# Patient Record
Sex: Male | Born: 1950 | Race: White | Hispanic: No | Marital: Married | Smoking: Never smoker
Health system: Southern US, Community
[De-identification: ages and names within clinical notes are randomized; demographics above are authoritative.]

## PROBLEM LIST (undated history)

## (undated) DIAGNOSIS — M545 Low back pain, unspecified: Secondary | ICD-10-CM

## (undated) DIAGNOSIS — Z8719 Personal history of other diseases of the digestive system: Secondary | ICD-10-CM

## (undated) DIAGNOSIS — IMO0001 Reserved for inherently not codable concepts without codable children: Secondary | ICD-10-CM

## (undated) DIAGNOSIS — G629 Polyneuropathy, unspecified: Secondary | ICD-10-CM

## (undated) DIAGNOSIS — C430 Malignant melanoma of lip: Secondary | ICD-10-CM

## (undated) DIAGNOSIS — K219 Gastro-esophageal reflux disease without esophagitis: Secondary | ICD-10-CM

## (undated) DIAGNOSIS — Z531 Procedure and treatment not carried out because of patient's decision for reasons of belief and group pressure: Secondary | ICD-10-CM

## (undated) DIAGNOSIS — Z201 Contact with and (suspected) exposure to tuberculosis: Secondary | ICD-10-CM

## (undated) DIAGNOSIS — E785 Hyperlipidemia, unspecified: Secondary | ICD-10-CM

## (undated) DIAGNOSIS — G8929 Other chronic pain: Secondary | ICD-10-CM

## (undated) DIAGNOSIS — I2510 Atherosclerotic heart disease of native coronary artery without angina pectoris: Secondary | ICD-10-CM

## (undated) DIAGNOSIS — C44310 Basal cell carcinoma of skin of unspecified parts of face: Secondary | ICD-10-CM

## (undated) HISTORY — PX: CORONARY ANGIOPLASTY WITH STENT PLACEMENT: SHX49

## (undated) HISTORY — DX: Polyneuropathy, unspecified: G62.9

## (undated) HISTORY — PX: TONSILLECTOMY: SUR1361

## (undated) HISTORY — DX: Hyperlipidemia, unspecified: E78.5

## (undated) HISTORY — DX: Atherosclerotic heart disease of native coronary artery without angina pectoris: I25.10

---

## 2001-05-10 ENCOUNTER — Inpatient Hospital Stay (HOSPITAL_COMMUNITY): Admission: AD | Admit: 2001-05-10 | Discharge: 2001-05-14 | Payer: Self-pay | Admitting: Cardiology

## 2001-05-10 ENCOUNTER — Encounter: Payer: Self-pay | Admitting: Cardiology

## 2001-10-01 HISTORY — PX: POSTERIOR LUMBAR FUSION: SHX6036

## 2001-10-24 ENCOUNTER — Inpatient Hospital Stay (HOSPITAL_COMMUNITY): Admission: EM | Admit: 2001-10-24 | Discharge: 2001-11-04 | Payer: Self-pay | Admitting: Neurosurgery

## 2001-10-25 ENCOUNTER — Encounter: Payer: Self-pay | Admitting: Neurosurgery

## 2001-10-30 ENCOUNTER — Encounter: Payer: Self-pay | Admitting: Neurosurgery

## 2001-11-04 ENCOUNTER — Inpatient Hospital Stay (HOSPITAL_COMMUNITY)
Admission: AD | Admit: 2001-11-04 | Discharge: 2001-11-11 | Payer: Self-pay | Admitting: Physical Medicine & Rehabilitation

## 2001-12-31 ENCOUNTER — Encounter: Payer: Self-pay | Admitting: Neurosurgery

## 2001-12-31 ENCOUNTER — Encounter: Admission: RE | Admit: 2001-12-31 | Discharge: 2001-12-31 | Payer: Self-pay | Admitting: Neurosurgery

## 2002-04-15 ENCOUNTER — Encounter: Admission: RE | Admit: 2002-04-15 | Discharge: 2002-04-15 | Payer: Self-pay | Admitting: Neurosurgery

## 2002-04-15 ENCOUNTER — Encounter: Payer: Self-pay | Admitting: Neurosurgery

## 2010-01-06 ENCOUNTER — Encounter: Payer: Self-pay | Admitting: Cardiology

## 2010-01-17 ENCOUNTER — Encounter: Payer: Self-pay | Admitting: Cardiology

## 2010-01-31 HISTORY — PX: CARDIAC CATHETERIZATION: SHX172

## 2010-02-15 ENCOUNTER — Ambulatory Visit: Payer: Self-pay | Admitting: Cardiology

## 2010-02-15 DIAGNOSIS — E785 Hyperlipidemia, unspecified: Secondary | ICD-10-CM | POA: Insufficient documentation

## 2010-02-15 DIAGNOSIS — I25118 Atherosclerotic heart disease of native coronary artery with other forms of angina pectoris: Secondary | ICD-10-CM | POA: Insufficient documentation

## 2010-02-15 DIAGNOSIS — R079 Chest pain, unspecified: Secondary | ICD-10-CM | POA: Insufficient documentation

## 2010-02-17 ENCOUNTER — Observation Stay (HOSPITAL_COMMUNITY): Admission: RE | Admit: 2010-02-17 | Discharge: 2010-02-17 | Payer: Self-pay | Admitting: Cardiology

## 2010-02-17 ENCOUNTER — Ambulatory Visit: Payer: Self-pay | Admitting: Cardiology

## 2010-03-16 ENCOUNTER — Ambulatory Visit: Payer: Self-pay | Admitting: Cardiology

## 2010-05-04 ENCOUNTER — Encounter: Payer: Self-pay | Admitting: Cardiology

## 2010-05-16 ENCOUNTER — Telehealth: Payer: Self-pay | Admitting: Cardiology

## 2010-06-16 ENCOUNTER — Ambulatory Visit: Payer: Self-pay | Admitting: Cardiology

## 2010-06-23 ENCOUNTER — Encounter: Payer: Self-pay | Admitting: Cardiology

## 2010-06-30 ENCOUNTER — Encounter: Payer: Self-pay | Admitting: Cardiology

## 2010-06-30 ENCOUNTER — Ambulatory Visit: Admit: 2010-06-30 | Payer: Self-pay | Admitting: Cardiology

## 2010-06-30 ENCOUNTER — Ambulatory Visit (HOSPITAL_COMMUNITY)
Admission: RE | Admit: 2010-06-30 | Discharge: 2010-06-30 | Payer: Self-pay | Source: Home / Self Care | Attending: Cardiology | Admitting: Cardiology

## 2010-06-30 DIAGNOSIS — R5381 Other malaise: Secondary | ICD-10-CM | POA: Insufficient documentation

## 2010-06-30 DIAGNOSIS — R05 Cough: Secondary | ICD-10-CM

## 2010-06-30 DIAGNOSIS — R059 Cough, unspecified: Secondary | ICD-10-CM | POA: Insufficient documentation

## 2010-06-30 DIAGNOSIS — R5383 Other fatigue: Secondary | ICD-10-CM

## 2010-07-01 ENCOUNTER — Telehealth: Payer: Self-pay | Admitting: Cardiology

## 2010-07-18 ENCOUNTER — Telehealth: Payer: Self-pay | Admitting: Cardiology

## 2010-07-31 LAB — CONVERTED CEMR LAB
BUN: 9 mg/dL (ref 6–23)
Calcium: 9 mg/dL (ref 8.4–10.5)
Creatinine, Ser: 0.9 mg/dL (ref 0.4–1.5)
Eosinophils Relative: 1.4 % (ref 0.0–5.0)
GFR calc non Af Amer: 89.23 mL/min (ref >60)
INR: 1 (ref 0.8–1.0)
Monocytes Relative: 6.2 % (ref 3.0–12.0)
Neutrophils Relative %: 60.7 % (ref 43.0–77.0)
Platelets: 220 10*3/uL (ref 150.0–400.0)
Prothrombin Time: 10.3 s (ref 9.7–11.8)
WBC: 7.1 10*3/uL (ref 4.5–10.5)

## 2010-08-02 NOTE — Progress Notes (Signed)
Summary: lipid results 05/06/10  Phone Note Outgoing Call   Call placed by: Katina Dung, RN, BSN,  May 16, 2010 12:37 PM Call placed to: Patient Summary of Call: cholesterol results 05/06/10 from [facility]  Follow-up for Phone Call        Dr Shirlee Latch reviewed lipid results done 05/06/10 faxed from [facility] -LDL 92 -Dr Alford Highland  recommendation was to stop Pravastatin and start Crestor 10mg  daily, if not Lipitor 20mg  LMTCB Katina Dung, RN, BSN  May 16, 2010 12:42 PM   Additional Follow-up for Phone Call Additional follow up Details #1::        per  deborah calling back  [phone] Marc Montes  May 16, 2010 12:52 PM      Appended Document: lipid results 05/06/10 discussed results with pt and wife--pt will stop Pravastatin and start Crestor 10mg  daily--he will plan to repeat L/L 07/15/10--I will give him an order for L/L when he sees Dr Shirlee Latch  06/16/10 so he can have this done at his local PCP   Clinical Lists Changes  Medications: Changed medication from PRAVACHOL 80 MG TABS (PRAVASTATIN SODIUM) one daily in the evening to CRESTOR 10 MG TABS (ROSUVASTATIN CALCIUM) one daily - Signed Rx of CRESTOR 10 MG TABS (ROSUVASTATIN CALCIUM) one daily;  #30 x 3;  Signed;  Entered by: Katina Dung, RN, BSN;  Authorized by: Marca Ancona, MD;  Method used: Electronically to [facility]*, [address], [place], [place]  [address], Ph: [phone], Fax: [phone] Observations: Added new observation of MEDRECON: current updated (05/19/2010 18:07)    Prescriptions: CRESTOR 10 MG TABS (ROSUVASTATIN CALCIUM) one daily  #30 x 3   Entered by:   Katina Dung, RN, BSN   Authorized by:   Marca Ancona, MD   Signed by:   Katina Dung, RN, BSN on 05/19/2010   Method used:   Electronically to        [facility]* (retail)       [address]       [place], [place]  [address]       Ph: [phone]       Fax: [phone]   RxID:    [id number]     Current Medications (verified): 1)  Robaxin 500 Mg Tabs (Methocarbamol) .Marland Kitchen.. 1 Tab By Mouth Once Daily 2)  Neurontin 600 Mg Tabs (Gabapentin) .Marland Kitchen.. 1 Tab By Mouth Three Times A Day 3)  Crestor 10 Mg Tabs (Rosuvastatin Calcium) .... One Daily 4)  Aspirin 81 Mg Tbec (Aspirin) .... Take One Tablet By Mouth Daily 5)  Calcium .... 2 Tabs By Mouth Once Daily 6)  Glucosamine-Chondroitin   Caps (Glucosamine-Chondroit-Vit C-Mn) .Marland Kitchen.. 1 Tab By Mouth Once Daily 7)  Niacin .Marland Kitchen.. 1 Tab By Mouth Once Daily 8)  Folic Acid .Marland Kitchen.. 1 Tab By Mouth Once Daily 9)  Metoprolol Tartrate 25 Mg Tabs (Metoprolol Tartrate) .... One -Half Tablet Twice A Day 10)  Tylox 5-500 Mg Caps (Oxycodone-Acetaminophen) .... As Needed For Pain 11)  Isosorbide Mononitrate Cr 30 Mg Xr24h-Tab (Isosorbide Mononitrate) .... Take One Tablet Once Daily  Allergies: 1)  ! Zocor

## 2010-08-02 NOTE — Assessment & Plan Note (Signed)
Summary: f/u cath done 02-17-10/sl   Primary Taran Haynesworth:  Dr. Michel Santee  CC:  f/u cath done 02/17/10/  Pt states he has not had any chest pain or difficulties since.Marland Kitchen  History of Present Illness: 60 yo with history of CAD s/p LAD PCI in 11/02 returns for evaluation of exertional chest pain after recent cath.  Patient was getting chest tightness every time he would walk up an incline behind his house.  Left heart cath was done given the typical angina, showing a long segment from the LAD ostium throughout the proximal LAD with moderate stenosis (60-70% at most). To treat this lesion would require either a long segment of overlapping drug-eluting stents (increasing risk of stent thrombosis over time) or a CABG with LIMA-LAD.  We elected to manage medically as the patient's symptoms have been only with moderate to heavy exertion.  SInce the cath, patient has been on vacation at the beach.  He would walk up to a mile at the beach with no chest pain.  In fact, since the cath, he has had no significnat chest pain.  He is now on Imdur.  However, he has not yet climbed the hill that has been causing the angina.    Labs (7/11): K 5, creatinine 1.1, HCT 41.9, HDL 44, LDL 92, LFTs normal, TSH normal Labs (8/11): K 4.2, creatinine 0.9  Current Medications (verified): 1)  Robaxin 500 Mg Tabs (Methocarbamol) .Marland Kitchen.. 1 Tab By Mouth Once Daily 2)  Neurontin 600 Mg Tabs (Gabapentin) .Marland Kitchen.. 1 Tab By Mouth Three Times A Day 3)  Pravachol 80 Mg Tabs (Pravastatin Sodium) .... One Daily in The Evening 4)  Aspirin 81 Mg Tbec (Aspirin) .... Take One Tablet By Mouth Daily 5)  Calcium .... 2 Tabs By Mouth Once Daily 6)  Glucosamine-Chondroitin   Caps (Glucosamine-Chondroit-Vit C-Mn) .Marland Kitchen.. 1 Tab By Mouth Once Daily 7)  Niacin .Marland Kitchen.. 1 Tab By Mouth Once Daily 8)  Folic Acid .Marland Kitchen.. 1 Tab By Mouth Once Daily 9)  Metoprolol Tartrate 25 Mg Tabs (Metoprolol Tartrate) .... One -Half Tablet Twice A Day 10)  Tylox 5-500 Mg Caps  (Oxycodone-Acetaminophen) .... As Needed For Pain 11)  Isosorbide Mononitrate Cr 30 Mg Xr24h-Tab (Isosorbide Mononitrate) .... Take One Tablet Once Daily  Allergies (verified): 1)  ! Zocor  Past History:  Past Medical History: 1. CAD: Cath in 11/02 after he developed exertional chest pain and had an abnormal myoview.  This showed 90% proximal LAD stenosis.  Patient had BMS to LAD.  EF was 55%.  LHC (8/11): radial approach, EF 60%< 60-70% ostial LAD, proximal LAD stent with 50-60% in-stent restenosis, ostium of D2 is compressed by the LAD stent with 80% ostial D2 stenosis.   2. Traumatic L1 burst fracture s/p surgery.  Chronic back pain with disability. 3. Peripheral neuropathy 4. Hyperlipidemia: simvastatin caused severe myalgias. Tolerating pravastatin.  5. Smoker  Family History: Reviewed history from 02/15/2010 and no changes required. Noncontributory.   Social History: Reviewed history from 02/15/2010 and no changes required. Retired Curator, disability from back injury.  Married-Lives in [place] Tobacco Use - Yes.  Alcohol Use - no  Review of Systems       All systems reviewed and negative except as per HPI.   Vital Signs:  Patient profile:   60 year old male Height:      68 inches Weight:      182 pounds BMI:     27.77 Pulse rate:   62 / minute Pulse rhythm:  regular BP sitting:   116 / 74  (left arm) Cuff size:   regular  Vitals Entered By: Judithe Modest CMA (March 16, 2010 11:24 AM)  Physical Exam  General:  Well developed, well nourished, in no acute distress. Neck:  Neck supple, no JVD. No masses, thyromegaly or abnormal cervical nodes. Lungs:  Clear bilaterally to auscultation and percussion. Heart:  Non-displaced PMI, chest non-tender; regular rate and rhythm, S1, S2 without murmurs, rubs or gallops. Carotid upstroke normal, no bruit. Pedals normal pulses. No edema, no varicosities. Abdomen:  Bowel sounds positive; abdomen soft and non-tender  without masses, organomegaly, or hernias noted. No hepatosplenomegaly. Extremities:  No clubbing or cyanosis. Neurologic:  Alert and oriented x 3. Psych:  Normal affect.   Impression & Recommendations:  Problem # 1:  CORONARY ATHEROSCLEROSIS NATIVE CORONARY ARTERY (ICD-414.01) Patient's anginal symptoms likely originate from the long, moderate (60-70%) ostial to proximal LAD stenosis.  The symptoms are likely a function of the length of the stenosed segment rather than the absolute degree of stenosis.  Percutaneous intervention would require a long segment of overlapping drug eluting stents from the LAD ostium throughout the proximal LAD. This would put the patient at higher risk of stent thrombosis down the road.  The other option would be a LIMA-LAD.  However, given the symptoms only with moderate to severe exertion, I will manage medically for now.  He has strated Imdur and has been pain-free since the cath.  - I asked the patient to try walking up the hill that has given him the chest pain in the past to see if he is on an adequate dose of Imdur.   - Continue Imdur and metoprolol for anginal relief.  - ASA 81 mg daily, pravastatin - If patient has recurrent intractable symptons unresponsive to medications, I might lean toward an off-pump LIMA-LAD.   Problem # 2:  HYPERLIPIDEMIA-MIXED (ICD-272.4) Lipids/LFTs in 10/11.  Goal LDL < 70.   Problem # 3:  SMOKING Patient was strongly encouraged to quit.   Patient Instructions: 1)  Your physician recommends that you return for a FASTING lipid profile/liver profile in October--you have the order. 2)  Your physician recommends that you schedule a follow-up appointment in: 3 months with Dr Shirlee Latch. 3)  Call if you have chest pain--[phone]   Luana Shu  Prescriptions: PRAVACHOL 80 MG TABS (PRAVASTATIN SODIUM) one daily in the evening  #30 x 6   Entered by:   Katina Dung, RN, BSN   Authorized by:   Marca Ancona, MD   Signed by:    Katina Dung, RN, BSN on 03/16/2010   Method used:   Electronically to        [facility]* (retail)       [address]       [place], [place]  [address]       Ph: [phone]       Fax: [phone]   RxID:   [id number]

## 2010-08-02 NOTE — Letter (Signed)
Summary: Cardiac Catheterization Instructions- Main Lab  [facility], Main Office  [address]   [place], [place] [address]   Phone: [phone]  Fax: [phone]     02/15/2010 MRN: [id number]  [facility] [address] [place], [place]  [address]  Dear Mr. Saling,   You are scheduled for Cardiac Catheterization on Thursday August 18,2011              with Dr. Shirlee Latch.             Please arrive at the [facility] at 7:30      a.m. on the day of your procedure.  1. DIET     _x___ Nothing to eat or drink after midnight except your medications with a sip of water.    2. MAKE SURE YOU TAKE YOUR ASPIRIN.  3.       __x__ YOU MAY TAKE ALL of your remaining medications with a small amount of water.      ___x_ START NEW medications:     Metoprolol 12.5mg  twice a day.       4. Plan for one night stay - bring personal belongings (i.e. toothpaste, toothbrush, etc.)  5. Bring a current list of your medications and current insurance cards.  6. Must have a responsible person to drive you home.   7. Someone must be with yu for the first 24 hours after you arrive home.  8. Please wear clothes that are easy to get on and off and wear slip-on shoes.  *Special note: Every effort is made to have your procedure done on time.  Occasionally there are emergencies that present themselves at the hospital that may cause delays.  Please be patient if a delay does occur.  If you have any questions after you get home, please call the office at the number listed above.  Katina Dung, RN, BSN

## 2010-08-02 NOTE — Cardiovascular Report (Signed)
Summary: Pre Op Orders   Pre Op Orders   Imported By: Roderic Ovens 02/17/2010 12:54:46  _____________________________________________________________________  External Attachment:    Type:   Image     Comment:   External Document

## 2010-08-02 NOTE — Assessment & Plan Note (Signed)
Summary: np6/CAD/eval for stress test/jml   Primary Provider:  Dr. Michel Santee  CC:  chest pain.Marland KitchenMarland KitchenSeverity of pain 3.  History of Present Illness: 60 yo with history of CAD s/p LAD PCI in 11/02 returns for evaluation of exertional chest pain.  Patient had anginal-type chest pain and and abnormal myoview with anteroseptal ischemia in 2002, followed by a cath showing proximal LAD severe stenosis, treated with a bare metal stent.  Patient did well for a number of years, but recently has developed tightness substernally accompanied by shortness of breath when he walks up an incline, especially walking from his toolshed back to his house.  This has been going on for 6 months but has gotten worse recently.  Pain resolves with rest.  He took a nitroglycerine tablet recently (the first since prior to last PCI) with complete relief of pain.  These episodes have been scaring him.  He will go to bed for the rest of the day after they occur, according to his wife.  He is disabled from back pain (traumatic L-spine burst fracture) but in general is active.  He does not smoke.  Aside from this chest pain and his chronic back pain, he has had no other problems.    ECG: NSR, normal  Labs (7/11): K 5, creatinine 1.1, HCT 41.9, HDL 44, LDL 92, LFTs normal, TSH normal  Current Medications (verified): 1)  Robaxin 500 Mg Tabs (Methocarbamol) .Marland Kitchen.. 1 Tab By Mouth Once Daily 2)  Neurontin 600 Mg Tabs (Gabapentin) .Marland Kitchen.. 1 Tab By Mouth Two Times A Day 3)  Oxycodone Hcl 5 Mg Tabs (Oxycodone Hcl) .... As Needed 4)  Pravastatin Sodium 40 Mg Tabs (Pravastatin Sodium) .Marland Kitchen.. 1 Tab By Mouth Once Daily 5)  Aspirin 81 Mg Tbec (Aspirin) .... Take One Tablet By Mouth Daily 6)  Calcium .... 2 Tabs By Mouth Once Daily 7)  Glucosamine-Chondroitin   Caps (Glucosamine-Chondroit-Vit C-Mn) .Marland Kitchen.. 1 Tab By Mouth Once Daily 8)  Niacin .Marland Kitchen.. 1 Tab By Mouth Once Daily 9)  Folic Acid .Marland Kitchen.. 1 Tab By Mouth Once Daily  Allergies: 1)  ! Zocor  Past  History:  Past Medical History: 1. CAD: Cath in 11/02 after he developed exertional chest pain and had an abnormal myoview.  This showed 90% proximal LAD stenosis.  Patient had BMS to LAD.  EF was 55%.  2. Traumatic L1 burst fracture s/p surgery.  Chronic back pain with disability. 3. Peripheral neuropathy 4. Hyperlipidemia: simvastatin caused severe myalgias. Tolerating pravastatin.  5. Smoker  Family History: Noncontributory.   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  Alcohol Use - no  Review of Systems       All systems reviewed and negative except as per HPI.   Vital Signs:  Patient profile:   60 year old male Height:      68 inches Weight:      179 pounds BMI:     27.32 Pulse rate:   61 / minute Resp:     14 per minute BP sitting:   141 / 86  (left arm)  Vitals Entered By: Kem Parkinson (February 15, 2010 11:48 AM)  Physical Exam  General:  Well developed, well nourished, in no acute distress. Head:  normocephalic and atraumatic Nose:  no deformity, discharge, inflammation, or lesions Mouth:  Teeth, gums and palate normal. Oral mucosa normal. Neck:  Neck supple, no JVD. No masses, thyromegaly or abnormal cervical nodes. Lungs:  Clear  bilaterally to auscultation and percussion. Heart:  Non-displaced PMI, chest non-tender; regular rate and rhythm, S1, S2 without murmurs, rubs or gallops. Carotid upstroke normal, no bruit. Pedals normal pulses. No edema, no varicosities. Abdomen:  Bowel sounds positive; abdomen soft and non-tender without masses, organomegaly, or hernias noted. No hepatosplenomegaly. Extremities:  No clubbing or cyanosis. Neurologic:  Alert and oriented x 3. Skin:  Intact without lesions or rashes. Psych:  Normal affect.   Impression & Recommendations:  Problem # 1:  CORONARY ATHEROSCLEROSIS NATIVE CORONARY ARTERY (ICD-414.01) Patient has known CAD s/p BMS to proximal LAD  in 2002.  He has developed exertional chest pain and dyspnea with walking up an incline that is relieved by rest or nitroglycerine.  It is very similar to the symptoms prior to his cath in 2002.  I suspect that this is angina.  It has become somewhat worse in the last few weeks.  It never occurs at rest.  I talked with him about management strategies: myoview and medical management if only mild ischemia versus proceeding directly to cath.  He finds the symptoms very distressing and would like to proceed to cath, which I think is reasonable.  We will do this with a radial approach.  He will start metoprolol 12.5 mg two times a day and continue ASA and statin.  He has sublingual NTG.   Problem # 2:  HYPERLIPIDEMIA-MIXED (ICD-272.4) Goal LDL < 70, increase pravastatin to 80 mg daily.   Problem # 3:  SMOKING He needs to stop smoking.   Other Orders: TLB-BMP (Basic Metabolic Panel-BMET) (80048-METABOL) TLB-CBC Platelet - w/Differential (85025-CBCD) TLB-PT (Protime) (85610-PTP) Cardiac Catheterization (Cardiac Cath)  Patient Instructions: 1)  Your physician recommends that you have lab today---BMP/CBC/PT--786.50  414.01 272.0 2)  Your physician has requested that you have a cardiac catheterization.  Cardiac catheterization is used to diagnose and/or treat various heart conditions. Doctors may recommend this procedure for a number of different reasons. The most common reason is to evaluate chest pain. Chest pain can be a symptom of coronary artery disease (CAD), and cardiac catheterization can show whether plaque is narrowing or blocking your heart's arteries. This procedure is also used to evaluate the valves, as well as measure the blood flow and oxygen levels in different parts of your heart.  For further information please visit [email or web address]  Please follow instruction sheet, as given. 3)  Thursday August 18,2011 4)     5)  Increase Pravastatin(Pravachol) to 80mg  daily in the evening. 6)   Start Metoprolol 12.5mg  twice a day--this will be one-half of a 25mg  tablet twice a day. 7)  Your physician recommends that you schedule a follow-up appointment in: 2-3 weeks with Dr Shirlee Latch. Prescriptions: METOPROLOL TARTRATE 25 MG TABS (METOPROLOL TARTRATE) one -half tablet twice a day  #30 x 6   Entered by:   Katina Dung, RN, BSN   Authorized by:   Marca Ancona, MD   Signed by:   Katina Dung, RN, BSN on 02/15/2010   Method used:   Electronically to        [facility]* (retail)       [address]       [place], [place]  [address]       Ph: [phone]       Fax: [phone]   RxID:   [id number] PRAVACHOL 80 MG TABS (PRAVASTATIN SODIUM) one daily in the evening  #30 x 3   Entered by:   Katina Dung, RN, BSN   Authorized  by:   Marca Ancona, MD   Signed by:   Katina Dung, RN, BSN on 02/15/2010   Method used:   Electronically to        [facility]* (retail)       [address]       [place], [place]  [address]       Ph: [phone]       Fax: [phone]   RxID:   [id number]        Appended Document: np6/CAD/eval for stress test/jml faxed to [facility] [phone] fax---ph [phone]

## 2010-08-02 NOTE — Letter (Signed)
Summary: [facility] Office Visit Note   [facility] Office Visit Note   Imported By: Roderic Ovens 02/18/2010 10:31:59  _____________________________________________________________________  External Attachment:    Type:   Image     Comment:   External Document

## 2010-08-04 NOTE — Miscellaneous (Signed)
  Clinical Lists Changes  Observations: Added new observation of CARDCATHFIND:  IMPRESSION:  The patient has chronic stable angina that occurs when he walks up a hill, chest pain is mild, it resolves with rest.  He does have extensive plaque starting in the ostial LAD and extending throughout the proximal LAD including diffuse moderate in-stent restenosis of the proximal LAD stent.  There is 60-70% ostial stenosis and about 50-60% in- stent restenosis in the proximal LAD stent.  The patient's symptoms are stable and relatively mild.  Interventional approach to this lesion would involve placing drug eluting stents from the ostial LAD throughout a significant portion of the proximal LAD to cover the full lesion.  I suspect that it is the length of the lesion rather than the absolute degree of stenosis that is leading to his symptoms.  Long segment PCI with drug eluting stents would place this patient at higher risk of future stent thrombosis.  Since his symptoms are mild, I will start with medical management.  If he fails medical management, I would consider off pump single LIMA-LAD.   (02/18/2010 13:46)      Cardiac Cath  Procedure date:  02/18/2010  Findings:       IMPRESSION:  The patient has chronic stable angina that occurs when he walks up a hill, chest pain is mild, it resolves with rest.  He does have extensive plaque starting in the ostial LAD and extending throughout the proximal LAD including diffuse moderate in-stent restenosis of the proximal LAD stent.  There is 60-70% ostial stenosis and about 50-60% in- stent restenosis in the proximal LAD stent.  The patient's symptoms are stable and relatively mild.  Interventional approach to this lesion would involve placing drug eluting stents from the ostial LAD throughout a significant portion of the proximal LAD to cover the full lesion.  I suspect that it is the length of the lesion rather than the absolute degree of stenosis that is  leading to his symptoms.  Long segment PCI with drug eluting stents would place this patient at higher risk of future stent thrombosis.  Since his symptoms are mild, I will start with medical management.  If he fails medical management, I would consider off pump single LIMA-LAD.

## 2010-08-04 NOTE — Progress Notes (Signed)
Summary: rtn call from yesterday  Phone Note Call from Patient Call back at Home Phone [phone]   Caller: Spouse Reason for Call: Talk to Nurse, Talk to Doctor Summary of Call: rtn call from yesterday Initial call taken by: Omer Jack,  July 01, 2010 8:08 AM  Follow-up for Phone Call        adv pt of results and that z-pac will be ordered at [facility] in [place]. Also spouse adv that pain med to be added to list is Percocet 5/325 3 x d as needed. Follow-up by: Claris Gladden RN,  July 01, 2010 8:21 AM    New/Updated Medications: OXYCODONE-ACETAMINOPHEN 5-325 MG TABS (OXYCODONE-ACETAMINOPHEN) three times a day prn ZITHROMAX Z-PAK 250 MG TABS (AZITHROMYCIN) 500mg  x 1 day, 250mg  x 4 days Prescriptions: ZITHROMAX Z-PAK 250 MG TABS (AZITHROMYCIN) 500mg  x 1 day, 250mg  x 4 days  #1 x 0   Entered by:   Claris Gladden RN   Authorized by:   Marca Ancona, MD   Signed by:   Claris Gladden RN on 07/01/2010   Method used:   Electronically to        [facility] (retail)       [address]       [place], [place]  [address]       Ph: [phone]       Fax: [phone]   RxID:   [id number]

## 2010-08-04 NOTE — Progress Notes (Signed)
Summary: B/P followup  Phone Note Outgoing Call   Call placed by: Katina Dung, RN, BSN,  July 18, 2010 5:42 PM Call placed to: Patient Summary of Call: B/P followup  Follow-up for Phone Call        I talked with pt about his B/P ---he states he has been checking it daily--he states it has been in the 124/70-80 range--he denies any lightheadedness or dizziness--he states his BP has not been low--I will forward to Dr Shirlee Latch for review

## 2010-08-04 NOTE — Assessment & Plan Note (Signed)
Summary: 3 month rov.sl   Visit Type:  3 month rov Primary Provider:  Dr. Michel Santee  CC:  chest pain, numbness in left arm & right foot, and dizziness if he stands up too fast.  History of Present Illness: 60 yo with history of CAD s/p LAD PCI in 11/02 returns for evaluation after recent cath.  Patient was getting chest tightness every time he would walk up an incline behind his house.  Left heart cath was done in 8/11 given the typical angina, showing a long segment from the LAD ostium throughout the proximal LAD with moderate stenosis (60-70% at most). To treat this lesion would require either a long segment of overlapping drug-eluting stents (increasing risk of stent thrombosis over time) or a CABG with LIMA-LAD.  We elected to manage medically as the patient's symptoms have been only with moderate to heavy exertion.    Lately, the patient has had no exertional chest pain on metoprolol and Imdur.  He is able to walk up the hill that previously caused angina with no problem.  He did have an episode of chest pain while lying in bed about a week ago that resolved spontaneously and has not recurred.    Patient has been feeling poorly in general for the last 2 months.  In 11/11, he dveloped cough and congestion for a couple of weeks and was treated by his PCP with antibiotics for bronchitis.  He felt better after this.  At the end of 11/11, he developed herpes zoster.  He then re-developed a cough with congestion and fatigue that has been present now for 3-4 weeks.  He has not been back to see his PCP. He does not have much motivation to be active.  He has some mild myalgias.  No recent GERD symptoms.   Labs (7/11): K 5, creatinine 1.1, HCT 41.9, HDL 44, LDL 92, LFTs normal, TSH normal Labs (8/11): K 4.2, creatinine 0.9 Labs (11/11): K 5.1, creatinine 1.12, LDL 92, HDL 44  ECG: NSR, normal  CXR (06/30/10): No PNA  Preventive Screening-Counseling & Management  Alcohol-Tobacco     Smoking Status:  never  Current Medications (verified): 1)  Robaxin 500 Mg Tabs (Methocarbamol) .Marland Kitchen.. 1 Tab By Mouth Once Daily 2)  Neurontin 600 Mg Tabs (Gabapentin) .Marland Kitchen.. 1 Tab By Mouth Three Times A Day 3)  Crestor 10 Mg Tabs (Rosuvastatin Calcium) .... One Daily 4)  Aspirin 81 Mg Tbec (Aspirin) .... Take One Tablet By Mouth Daily 5)  Calcium .... 2 Tabs By Mouth Once Daily 6)  Glucosamine-Chondroitin   Caps (Glucosamine-Chondroit-Vit C-Mn) .Marland Kitchen.. 1 Tab By Mouth Once Daily 7)  Niacin .Marland Kitchen.. 1 Tab By Mouth Once Daily 8)  Folic Acid .Marland Kitchen.. 1 Tab By Mouth Once Daily 9)  Metoprolol Tartrate 25 Mg Tabs (Metoprolol Tartrate) .... One -Half Tablet Twice A Day 10)  Tylox 5-500 Mg Caps (Oxycodone-Acetaminophen) .... As Needed For Pain 11)  Isosorbide Mononitrate Cr 30 Mg Xr24h-Tab (Isosorbide Mononitrate) .... Take One Tablet Once Daily  Allergies (verified): 1)  ! Zocor  Past History:  Past Medical History: 1. CAD: Cath in 11/02 after he developed exertional chest pain and had an abnormal myoview.  This showed 90% proximal LAD stenosis.  Patient had BMS to LAD.  EF was 55%.  LHC (8/11): radial approach, EF 60%< 60-70% ostial LAD, proximal LAD stent with 50-60% in-stent restenosis, ostium of D2 is compressed by the LAD stent with 80% ostial D2 stenosis.   2. Traumatic L1 burst fracture  s/p surgery.  Chronic back pain with disability. 3. Peripheral neuropathy 4. Hyperlipidemia: simvastatin caused severe myalgias. Tolerated pravastatin.  5. Prior smoker  Family History: Reviewed history from 02/15/2010 and no changes required. Noncontributory.   Social History: Reviewed history from 02/15/2010 and no changes required. Retired Curator, disability from back injury.  Married-Lives in [place] Alcohol Use - no Tobacco Use - No.  Smoking Status:  never  Review of Systems       All systems reviewed and negative except as per HPI.   Vital Signs:  Patient profile:   60 year old male Height:      68  inches Weight:      189 pounds BMI:     28.84 Pulse rate:   71 / minute BP sitting:   92 / 54  (left arm) Cuff size:   regular  Vitals Entered By: Caralee Ates CMA (June 30, 2010 10:51 AM)  Physical Exam  General:  Well developed, well nourished, in no acute distress. Neck:  Neck supple, no JVD. No masses, thyromegaly or abnormal cervical nodes. Lungs:  Clear bilaterally to auscultation and percussion. Heart:  Non-displaced PMI, chest non-tender; regular rate and rhythm, S1, S2 without murmurs, rubs or gallops. Carotid upstroke normal, no bruit. Pedals normal pulses. No edema, no varicosities. Abdomen:  Bowel sounds positive; abdomen soft and non-tender without masses, organomegaly, or hernias noted. No hepatosplenomegaly. Extremities:  No clubbing or cyanosis. Neurologic:  Alert and oriented x 3. Psych:  Normal affect.   Impression & Recommendations:  Problem # 1:  CORONARY ATHEROSCLEROSIS NATIVE CORONARY ARTERY (ICD-414.01) Patient's anginal symptoms likely originated from the long, moderate (60-70%) ostial to proximal LAD stenosis.  The symptoms were likely a function of the length of the stenosed segment rather than the absolute degree of stenosis.  Percutaneous intervention would require a long segment of overlapping drug eluting stents from the LAD ostium throughout the proximal LAD. This would put the patient at higher risk of stent thrombosis down the road.  The other option would be a LIMA-LAD.  We have managed him medically given stable symptoms.  He has had no further angina on metoprolol and Imdur.   - If patient has recurrent intractable symptons unresponsive to medications, I might lean toward an off-pump LIMA-LAD.  - Continue metoprolol and Imdur for angina control.  If patient remains fatigued, may need to stop metoprolol given lowish BP to see if he improves symptomatically.  - Continue ASA, statin.   Problem # 2:  HYPERLIPIDEMIA-MIXED (ICD-272.4) Recently changed  statin to Crestor for goal LDL < 70.  Some mild myalgias.  Needs lipids/LFTs/CPK in 1/12.  I will have him try coenzyme q10 100 mg daily.   Problem # 3:  COUGH (ICD-786.2) Cough/congestion x 3-4 weeks.  CXR today showed no PNA.  He is not on an ACEI.  I will given him a course of azithromycin for acute bronchitis.  If cough continues, could consider trying GERD treatment.    Other Orders: EKG w/ Interpretation (93000) T-2 View CXR (71020TC)  Patient Instructions: 1)  Your physician recommends that you schedule a follow-up appointment in: 3 months with Dr Shirlee Latch 2)  Your physician recommends that you have a FASTING lipid, liver and CK profile: a paper RX has been given to you to take to you primary care doctor 3)  Your physician has recommended you make the following change in your medication: start CO Q 10 once a day 4)  Check your blood pressure for  2 weeks and results into the office  [phone] 5)  A chest x-ray takes a picture of the organs and structures inside the chest, including the heart, lungs, and blood vessels. This test can show several things, including, whether the heart is enlarged; whether fluid is building up in the lungs; and whether pacemaker / defibrillator leads are still in place.  This will be done at the [facility] office.

## 2010-08-09 ENCOUNTER — Encounter: Payer: Self-pay | Admitting: Cardiology

## 2010-09-07 ENCOUNTER — Telehealth: Payer: Self-pay | Admitting: Cardiology

## 2010-09-13 NOTE — Progress Notes (Signed)
Summary:  question re meds  Phone Note Call from Patient Call back at [phone]   Reason for Call: Talk to Nurse Summary of Call: pt states has question re meds. pt states when he takes his meds his body hurts. Initial call taken by: Roe Coombs,  September 07, 2010 9:32 AM  Follow-up for Phone Call        Spoke with patient-states his lower legs hurt more since beginning on Metoprolol.  Wants to know if this med could possibly be why he is hurting more.  Has a hx of neuropathy in legs.   Follow-up by: Dessie Coma  LPN,  September 07, 2010 10:22 AM     Appended Document:  question re meds metoprolol very unlikely to cause this.   Appended Document:  question re meds Called patient back-informed him per Dr. Shirlee Latch, Metoprolol is very unlikely to cause this pain.  Advised to go to PCP.

## 2010-09-15 ENCOUNTER — Encounter: Payer: Self-pay | Admitting: Cardiology

## 2010-09-21 ENCOUNTER — Other Ambulatory Visit: Payer: Self-pay | Admitting: Cardiology

## 2010-09-29 ENCOUNTER — Ambulatory Visit: Payer: Self-pay | Admitting: Cardiology

## 2010-09-29 NOTE — Telephone Encounter (Signed)
[address] °

## 2010-10-27 ENCOUNTER — Encounter: Payer: Self-pay | Admitting: Cardiology

## 2010-10-27 ENCOUNTER — Ambulatory Visit (INDEPENDENT_AMBULATORY_CARE_PROVIDER_SITE_OTHER): Payer: Medicare Other | Admitting: Cardiology

## 2010-10-27 DIAGNOSIS — E785 Hyperlipidemia, unspecified: Secondary | ICD-10-CM

## 2010-10-27 DIAGNOSIS — I2510 Atherosclerotic heart disease of native coronary artery without angina pectoris: Secondary | ICD-10-CM

## 2010-10-28 ENCOUNTER — Encounter: Payer: Self-pay | Admitting: Cardiology

## 2010-10-28 NOTE — Assessment & Plan Note (Addendum)
Lipids have been at goal (LDL < 70) on Crestor.  He will add fish oil 2000 mg daily.

## 2010-10-28 NOTE — Progress Notes (Signed)
PCP: Dr. Michel Santee, [facility]  60 yo with history of CAD s/p LAD PCI in 11/02 returns for cardiology followup.  Last summer, patient was getting chest tightness every time he would walk up an incline behind his house.  Left heart cath was done in 8/11 given the typical angina, showing a long segment from the LAD ostium throughout the proximal LAD with moderate stenosis (60-70% at most). To treat this lesion would require either a long segment of overlapping drug-eluting stents (increasing risk of stent thrombosis over time) or a CABG with LIMA-LAD.  We elected to manage medically as the patient's symptoms have been only with moderate to heavy exertion.    Patient has been doing well recently.  He is on metoprolol and Imdur for angina and has had no further exertional chest pain.  He is able to climb the hill behind his house that used to cause him problems with no trouble.  He gets only mildly short of breath with heavy exertion.  He is active in general and does a lot of work out in his yard.    Labs (7/11): K 5, creatinine 1.1, HCT 41.9, HDL 44, LDL 92, LFTs normal, TSH normal Labs (8/11): K 4.2, creatinine 0.9 Labs (11/11): K 5.1, creatinine 1.12, LDL 92, HDL 44 Labs (2/12): LDL 57, HDL 45, creatinine 0.97  ECG: NSR, normal  Allergies (verified):  1)  ! Zocor (myalgias)  Past History:  Past Medical History: 1. CAD: Cath in 11/02 after he developed exertional chest pain and had an abnormal myoview.  This showed 90% proximal LAD stenosis.  Patient had BMS to LAD.  EF was 55%.  LHC (8/11): radial approach, EF 60%< 60-70% ostial LAD, proximal LAD stent with 50-60% in-stent restenosis, ostium of D2 is compressed by the LAD stent with 80% ostial D2 stenosis.   2. Traumatic L1 burst fracture s/p surgery.  Chronic back pain with disability. 3. Peripheral neuropathy 4. Hyperlipidemia: simvastatin caused severe myalgias. Tolerated pravastatin and Crestor.   Family History: Noncontributory.    Social History: Retired Curator, disability from back injury.  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  Smoking Status:  Prior.  Current Outpatient Prescriptions  Medication Sig Dispense Refill  . aspirin 81 MG tablet Take 81 mg by mouth daily.        . CRESTOR 10 MG tablet TAKE ONE TABLET BY MOUTH EVERY DAY  30 each  7  . gabapentin (NEURONTIN) 600 MG tablet Take 600 mg by mouth 3 (three) times daily.        . Glucosamine-Chondroitin (GLUCOSAMINE CHONDR COMPLEX PO) 1 TAB PO QD       . isosorbide mononitrate (IMDUR) 30 MG 24 hr tablet TAKE ONE TABLET BY MOUTH EVERY DAY  30 tablet  7  . methocarbamol (ROBAXIN) 500 MG tablet 1 tab po qd       . metoprolol tartrate (LOPRESSOR) 25 MG tablet TAKE ONE-HALF TABLET BY MOUTH TWICE DAILY  30 tablet  7  . oxyCODONE-acetaminophen (TYLOX) 5-500 MG per capsule Take 1 capsule by mouth every 4 (four) hours as needed.        Marland Kitchen oxyCODONE-acetaminophen (PERCOCET) 5-325 MG per tablet Take 1 tablet by mouth every 4 (four) hours as needed.          BP 130/76  Pulse 59  Ht 5\' 9"  (1.753 m)  Wt 181 lb 12.8 oz (82.464 kg)  BMI 26.85 kg/m2 General: NAD Neck: No JVD, no thyromegaly or  thyroid nodule.  Lungs: Clear to auscultation bilaterally with normal respiratory effort. CV: Nondisplaced PMI.  Heart regular S1/S2, no S3/S4, no murmur.  No peripheral edema.  No carotid bruit.  Normal pedal pulses.  Abdomen: Soft, nontender, no hepatosplenomegaly, no distention.   Neurologic: Alert and oriented x 3.  Psych: Normal affect. Extremities: No clubbing or cyanosis.

## 2010-10-28 NOTE — Assessment & Plan Note (Signed)
Patient's anginal symptoms likely originated from the long, moderate (60-70%) ostial to proximal LAD stenosis.  The symptoms were likely a function of the length of the stenosed segment rather than the absolute degree of stenosis.  Percutaneous intervention would require a long segment of overlapping drug eluting stents from the LAD ostium throughout the proximal LAD. This would put the patient at higher risk of stent thrombosis down the road.  The other option would be a LIMA-LAD.  We have managed him medically given stable symptoms.  He has had no further angina on metoprolol and Imdur.   - Continue ASA, statin, Imdur, and metoprolol.

## 2010-11-18 NOTE — Discharge Summary (Signed)
[place]. [facility]  Patient:    Marc Montes, Marc Montes Visit Number: [id number] MRN: [id number]          Service Type: MED Location: [phone] Attending Physician:  Mirian Mo Dictated by:   Jacolyn Reedy, P.A.C. Admit Date:  05/10/2001 Discharge Date: 05/14/2001   CC:         Dr. Gerhard Munch, [facility], [address] [place], [place] [address]   Discharge Summary  DATE OF BIRTH:  04-08-1951  MAIN DIAGNOSIS:  Chest pain with abnormal Cardiolite scan.  DISCHARGE DIAGNOSES: 1. Coronary artery disease, status post percutaneous transluminal coronary    angioplasty and stenting of the left anterior descending on 05/13/01 by    Dr. Charlies Constable, residual 30% mid left anterior descending, normal right    coronary artery and circumflex, normal left ventricular function, ejection    fraction 55%. 2. Hyperlipidemia. 3. Jehovahs Witness.  BRIEF HISTORY AND PHYSICAL:  Please see H&P for details.  This is a 60 year old married white male patient of Dr. 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  He had stress Cardiolite that showed anterior septal and apical ischemia, ejection fraction 70%.  He was referred to [place] for cardiac catheterization.  HOSPITAL COURSE:  The patient underwent left heart catheterization by Dr. Charlies Constable without difficulty.  He was found to have a 90% proximal LAD which was dilated to less than 10%.  He tolerated the procedure well.  There was residual 30% mid LAD, normal circumflex and RCA, normal LV function. Ejection fraction 55%.  The patients groin was stable without hematoma or hemorrhage, and he was discharged home the following day in stable condition.  LABORATORY VALUES:  Hemoglobin 14.6, hematocrit 42, white count 7.4, sodium 143, potassium 4.0, chloride 106, CO2 30, BUN 13, creatinine 1.0.  Coagulation times normal.  CK-MBs negative.  Troponins were slightly elevated at 0.07. LFTs  normal.  Cholesterol was 198, triglycerides 144, HDL 38, LDL 131.  TSH was normal at 3.9.  Chest x-ray - borderline cardiomegaly with mild chronic bronchitic changes and chronic interstitial lung disease.  No acute abnormality.  EKG - normal sinus rhythm.  No acute EKG changes.  DISCHARGE MEDICATIONS: 1. Plavix 75 mg once a day with food x 1 week. 2. Pravachol 20 mg q.d. 3. Coated aspirin once a day. 4. Nitroglycerin p.r.n.  He is to do no heavy lifting or strenuous activity for 2-3 weeks.  He is to follow a low-fat diet.  He is to have fasting lipid panel and LFTs checked in six weeks, and he should see Dr. Antoine Poche back at the [facility] on 05/29/01, at 11 a.m. Dictated by:   Jacolyn Reedy, P.A.C. Attending Physician:  Mirian Mo DD:  05/14/01 TD:  05/14/01 Job: [id number]

## 2010-11-18 NOTE — Discharge Summary (Signed)
[place]. [facility]  Patient:    Marc Montes, Marc Montes Visit Number: [id number] MRN: [id number]          Service Type: [facility] Location: [phone] Attending Physician:  Herold Harms Dictated by:   Julio Sicks, M.D. Admit Date:  11/04/2001 Discharge Date: 11/11/2001                             Discharge Summary  SERVICE:  Neurosurgery.  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  HISTORY OF PRESENT ILLNESS:  The patient is a 60 year old male who was injured in a work accident with a resultant L1 burst fracture.  The patient was transferred from an outside hospital greater than 24 hours after this accident.  The patient had some dysesthesias and weakness in both lower extremities, more pronounced on the left side.  CT scanning demonstrated a comminuted L1 burst fracture with spinal stenosis.  MRI scanning demonstrated a high signal within the spinal cord at L1.  The patient was counseled as to his options.  We discussed the possibility of undergoing lumbar decompression and fusion in the area in hopes of ablating some of his symptoms and stabilizing his spine; he wished to proceed.  HOSPITAL COURSE:  The patient was admitted to the hospital and observed overnight and taken to the operating room the next morning.  In the operating room, an uncomplicated thoracolumbar decompression and fusion were performed with long rod instrumentation from T11 to L3.  The procedure was uncomplicated.  Postoperatively, the patient awakened with improved strength and sensation in both distal lower extremities.  He still had some dysesthesia, however.  The patient had a marked amount of thoracolumbar pain which gradually cleared through his hospitalization.  He required parenteral pain medicine during this time.  He had physical therapy and rehab working with him.  He was placed in a thoracolumbar orthosis.  He was gradually mobilized.  He was  ready to be discharged to the rehab service.  At the time of discharge, the patients wound is healing well.  There is no evidence of infection.  He is tolerating a regular diet.  Bowel and bladder function are normal.  Pain is well-controlled on oral medications.  CONDITION AT DISCHARGE:  Stable. Dictated by:   Julio Sicks, M.D. Attending Physician:  Herold Harms DD:  12/18/01 TD:  12/20/01 Job: [id number]

## 2010-11-18 NOTE — Cardiovascular Report (Signed)
[place]. [facility]  Patient:    Marc Montes, Marc Montes Visit Number: [id number] MRN: [id number]          Service Type: MED Location: [phone] Attending Physician:  Mirian Mo Dictated by:   Everardo Beals Juanda Chance, M.D. [facility] Proc. Date: 05/13/01 Admit Date:  05/10/2001   CC:         Rollene Rotunda, M.D. 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  He was seen by Dr. Antoine Poche and admitted to the hospital for evaluation with angiography.  DESCRIPTION OF PROCEDURE:  The procedure was performed via the right femoral artery using an arterial sheath and 6-French preformed coronary catheters.  A front wall arterial puncture was performed.  Omnipaque contrast was used.  In the middle of the diagnostic procedure our digital angiography in laboratory room #1 became inoperable and we had to switch to laboratory room #7.  The diagnostic procedure was then finished and we made a decision to treat with intervention on the left anterior descending artery.  The patient was given weight-adjusted heparin to prolong an ACT of greater than 200 seconds and was given double bolus Integrilin and infusion.  We used a 6-French JL4 guiding catheter and a short Floppy wire.  We crossed the lesion in the proximal LAD with the wire without difficulty.  We direct-stented with a 2.25- x 16-mm Express 2 deploying this with one inflation of 10 atmospheres for 40 seconds.  We then post dilated with a 2.5- x 13-mm Quantum Maverick inflating this to 16 atmospheres for 30 seconds. This stent was positioned right at the proximal edge and inside the distal edge.  Repeat diagnostic studies were then performed through the  guiding catheter.  The patient tolerated the procedure well and left the laboratory in satisfactory condition.  RESULTS:  1. The left main coronary artery:  The left main coronary artery was free of    significant disease. 2. The left anterior descending artery:  The left anterior descending    artery was diffusely irregular and small in caliber.  The LAD gave rise to    two septal perforators and two diagonal branches.  There was a 90% stenosis    located just after the first diagonal branch which was segmental and about    12 mm in length.  There was another 30% lesion in the mid LAD. 3. The circumflex artery:  The circumflex artery gave rise to a large marginal    branch, a small marginal branch, and two posterolateral branches.  These    vessels were free of significant disease. 4. The right coronary artery:  The right coronary artery was a moderate sized    vessel that gave rise to a conus branch, two right ventricular branches, a    posterior descending branch, and a posterolateral branch.  These vessels    were free of significant disease.  Left ventriculogram:  The left ventriculogram, performed in the RAO projection, showed good wall motion with no areas of hypokinesis.  The estimated ejection fraction was 55%.  Following stenting of the left anterior descending artery, the stenosis improved from 90% to less than 10%.  There was no dissection seen.  The aortic pressure was 108/66 with a mean of 84.  The left ventricular  pressure was 108/6.  CONCLUSIONS: 1. Coronary artery disease with 90% stenosis in the proximal and 30% stenosis    in the mid left anterior descending artery, no major obstruction in the    circumflex and right coronary artery, and normal left ventricular function. 2. Successful stenting of the proximal left anterior descending artery lesion    with improvement in percent area narrowing from 90% to less than 10%.  DISPOSITION:  The patient was transferred  to the post angioplasty unit for further observation. Dictated by:   Everardo Beals Juanda Chance, M.D. [facility] Attending Physician:  Mirian Mo DD:  05/13/01 TD:  05/13/01 Job: [id number]

## 2010-11-18 NOTE — Discharge Summary (Signed)
[place]. [facility]  Patient:    Marc Montes, Marc Montes Visit Number: [id number] MRN: [id number]          Service Type: [facility] Location: [id number] Attending Physician:  Herold Harms Dictated by:   Mcarthur Rossetti. Angiulli, P.A. Admit Date:  11/04/2001 Discharge Date: 11/11/2001   CC:         Julio Sicks, M.D.   Discharge Summary  DISCHARGE DIAGNOSES: 1. L1 burst fracture, pain management. 2. Left foot drop secondary to #1. 3. Coronary artery disease with percutaneous transluminal coronary    angioplasty.  PROCEDURE:  Status post thoracolumbar decompression laminectomy with bilateral transpendicular decompression with open reduction of L1 fracture, April 25.  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  Upon evaluation, x-rays of L1 burst fracture spinal cord injury.  Underwent thoracolumbar decompression and fusion of laminectomy with bilateral L1 transpendicular decompression and open reduction L1 fracture, April 25, per Dr. Jordan Likes.  Placed in TLSO back brace. Pain management ongoing.  Noted left foot drop.  He was receiving a Decadron taper.  Follow-up x-rays of back with good alignment.  No chest pain or shortness of breath.  Minimal assistance for ambulation.  Latest chemistries unremarkable.  Admitted for a comprehensive rehabilitation program.  PAST MEDICAL HISTORY:  See discharge diagnoses.  PAST SURGICAL HISTORY:  Hemorrhoid surgery.  ALLERGIES:  ZOCOR, PRAVACHOL.  SOCIAL HISTORY:  No alcohol or tobacco.  PRIMARY M.D.:  Dr. Gerhard Munch of [place], [place].  MEDICATIONS:  Plavix and aspirin.  SOCIAL HISTORY:  Lives with his wife in [place].  Independent prior to admission.  He is a Curator.  One-level home.  Two steps to entry.  Wife and family assistance as needed on  discharge.  HOSPITAL COURSE:  The patient did well while on rehabilitation services with therapies initiated on a b.i.d. basis.  The following issues were followed during the patients rehabilitation course.  Pertaining to Mr. Semmel L1 burst fracture; surgical site healing nicely.  TLSO back brace when out of bed. He was able to put this on when sitting at bedside.  Pain management ongoing.  His bedtime Neurontin was increased with good results.  Decadron taper was completed.  He would follow up with neurosurgeon, Dr. Jordan Likes.  He was fitted with a left AFO brace for his left foot drop.  His calves remained cool without any swelling, erythema, and nontender.  He had no chest pain and no shortness of breath.  For noted history of coronary artery disease, he continued on his Plavix and aspirin with no bleeding episodes.  Blood pressures remained controlled with no orthostatic changes.  He had no bowel or bladder disturbances.  He was ambulating extended distances with a walker with AFO brace in place.  He received day passes that went quite well.  Family teaching was completed.  Latest labs showed a sodium of 141, potassium 4.2, BUN 13, creatinine 0.9, hemoglobin 10.5, hematocrit 30.9.  DISCHARGE MEDICATIONS: 1. Aspirin 81 mg daily. 2. Plavix 75 mg daily. 3. Oxycontin CR 10 mg every 12 hours x1 week and discontinue. 4. Neurontin 300 mg twice daily and 600 mg at bedtime. 5. Decadron taper. 6. Tylox as needed for pain. 7. Robaxin 500 mg three times daily as needed for spasms.  ACTIVITY:  As tolerated with back brace and AFO foot brace.  DIET:  Regular.  DISCHARGE INSTRUCTIONS:  No driving.  FOLLOW-UP:  Follow up with Dr. Jordan Likes of neurosurgery. Dictated by:   Mcarthur Rossetti. Angiulli, P.A. Attending Physician:  Herold Harms DD:  11/11/01 TD:  11/12/01 Job: [id number]

## 2010-11-18 NOTE — Op Note (Signed)
[place]. [facility]  Patient:    Marc Montes, Marc Montes Visit Number: [id number] MRN: [id number]          Service Type: SUR Location: [phone] Attending Physician:  Donn Pierini Dictated by:   Julio Sicks, M.D. Proc. Date: 10/25/01 Admit Date:  10/24/2001                             Operative Report  PREOPERATIVE DIAGNOSIS:  L1 burst fracture with spinal cord injury.  POSTOPERATIVE DIAGNOSIS:  L1 burst fracture with spinal cord injury.  OPERATION PERFORMED:  Thoracolumbar decompressive laminectomy with bilateral L1 transpedicular decompressions and open reduction of L1 fracture. Posterolateral fusion from T11 to L3 using segmental pedicle screw instrumentation, local autograft and ____________ bone graft substitute.  SURGEON:  Julio Sicks, M.D.  ASSISTANT:  Donalee Citrin, Montez Hageman.  ANESTHESIA:  General endotracheal.  INDICATIONS FOR PROCEDURE:  Mr. Sluder is a 60 year old old male who is status post an accident where a mobile home fell on top of him causing a resultant L1 burst fracture which was delayed in diagnosis.  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  The patient has an MRI scan and CT scan which demonstrates significant retropulsion of bone compromising 50% of the space of the spinal canal.  The fracture itself causes approximately 70% loss of height.  There is kyphotic angulation and there is continuing spinal cord compression.  We discussed options available for management including the possibility of undergoing a thoracolumbar decompression and fusion surgery for hopeful improvement in his situation and stabilization of his spine.  The patient is aware of the risks and benefits and wishes to proceed.  Complicating this is the patients desire to receive no blood products in the course of  operation. He realizes and recognizes that this places him at great perioperative risk, particularly with his previous coronary artery disease.  With that in mind, he still wishes to proceed as does his wife.  DESCRIPTION OF PROCEDURE:  The patient was taken to the operating room and placed on the table in a supine position.  After an adequate level of anesthesia was achieved, the patient was positioned prone onto a Wilson frame and appropriately padded.  The patients lumbar region was prepped and draped sterilely.  A 10 blade was used to make a linear skin incision extending from T10 to L4.  This was carried down sharply in the midline.  Subperiosteal dissection was performed bilaterally exposing the lamina and facet joints and transverse processes of T11, T12, L1, L2 and L3.  The deep self-retaining retractor was placed.  Intraoperative fluoroscopy was used and the level was confirmed.  Decompressive laminectomy was then performed at T12-L1 using a high speed drill and Kerrison rongeurs and the Leksell rongeurs to completely remove the lamina of T12 and L1 as well as the inferior facets of T12 and L1 bilaterally.  The pedicles at L1 were then isolated bilaterally.  These were then removed using a high speed drill down flush to the level of the vertebral body.  The pedicle was then further removed into the vertebral body itself. Then the fracture site was identified and the bony fracture was pushed away from the thecal sac down into the vertebral body.  This accomplished open reduction of the patients spinal fracture.  This removed all ventral  compression upon the thecal sac and spinal cord.  This was done without obvious injury to the thecal sac or spinal cord.  At the conclusion of the decompression, the patients ventral aspect of his epidural space was free and clear.  Gelfoam was used for hemostasis.  Pedicles at T11, T12, L2 and L3 were then isolated using surface landmarks and after  intraoperative fluoroscopy bilaterally.  Superficial bone was removed overlying the pedicles.  Each pedicle was then probed using the pedicle awl.  Each pedicle awl track was found to be solidly within bone using a blunt probe.  Each pedicle awl track was then tapped with a 5.5 mm screw tap.  At L2 and L3, 6.5 x 50 mm ____________ M10 pedicle screws were placed bilaterally.  At T12, 6.5 x 40 mm screws were placed bilaterally.  At T11, 5.5 x 40 mm screw were placed bilaterally.  Transverse processes, facet joints and lamina were then decorticated using the high speed drill.  The wound was given a final irrigation.  Morselized autograft recycled from the patients previous laminectomy as well as ____________ bone graft substitute was then packed posterolaterally.  In iliac crest autograft was not taken because of the patients strong desire and refusal to accept blood products.  It was thought that by foregoing an iliac crest harvest, we may be able to lessen the chance of significant perioperative blood loss.  After the bone and the bone graft substitute was then packed posterolaterally.  A titanium rod was then cut and contoured over the screw heads at the aforementioned level.  Locking caps were then placed on the screw heads.  Locking caps were then engaged distally.  The construct was then sequentially placed under compression.  All screws were given a final tightening.  Transverse connectors were placed at approximately T12 and L1.  Final images revealed good position of the bone grafts and hardware at the proper operative level with normal alignment of the spine with very good decompression and reduction of the patients deformity at L1.  The retraction system was removed.  Hemostasis was achieved with the Bovie. Epidural Hemovac drains were placed.  The wound was then closed in layers with Vicryl sutures.  Steri-Strips and sterile dressing were applied.  There were no apparent  complications.  The patient tolerated the procedure well and he returned to the recovery room postoperatively. Dictated by:   Julio Sicks, M.D.  Attending Physician:  Donn Pierini DD:  10/25/01 TD:  10/26/01 Job: [id number]

## 2011-02-09 ENCOUNTER — Encounter: Payer: Self-pay | Admitting: Physician Assistant

## 2011-02-09 ENCOUNTER — Ambulatory Visit (INDEPENDENT_AMBULATORY_CARE_PROVIDER_SITE_OTHER): Payer: Medicare Other | Admitting: Physician Assistant

## 2011-02-09 ENCOUNTER — Telehealth: Payer: Self-pay | Admitting: Cardiology

## 2011-02-09 VITALS — BP 130/78 | HR 53 | Resp 18 | Ht 68.0 in | Wt 188.0 lb

## 2011-02-09 DIAGNOSIS — I2510 Atherosclerotic heart disease of native coronary artery without angina pectoris: Secondary | ICD-10-CM

## 2011-02-09 DIAGNOSIS — R079 Chest pain, unspecified: Secondary | ICD-10-CM

## 2011-02-09 NOTE — Telephone Encounter (Signed)
Patient seen in office today. Tereso Newcomer, PA-C

## 2011-02-09 NOTE — Telephone Encounter (Signed)
Mr. Zaino calls today b/c the " I have been having a little pain on the right side of my chest coming up to the right arm then fades away"  Pt is taking all medications as prescribed.  He states that this pain is not like the pain he had associated with his stent placement last August.  He does note for the past 3-4 days some shortness of breath when walking up hill behind his house.  This is new.  He denies edema, chest pain, or angina at this time. He will see Lorin Picket PA today at 2:30pm.  He is aware to call 911 if symptoms become worse.   Mylo Red RN

## 2011-02-09 NOTE — Assessment & Plan Note (Signed)
Continue aspirin and statin.  Proceed with Myoview as noted.  Followup in one to 2 weeks.

## 2011-02-09 NOTE — Assessment & Plan Note (Signed)
His symptoms are somewhat atypical.  They are nothing like what he had prior to his stenting in 2002 nor are they like what he had prior to his cardiac catheterization in 8/11.  They're not clearly exertional.  Other than some radiation to his right arm, he does not have any other associated symptoms.  His ECG is unremarkable.  I recommend he proceed with a stress Myoview to rule out ischemia.  If he has anterior ischemia, he will need a repeat cardiac catheterization to further evaluate and make a decision regarding PCI versus CABG.  He can use Aleve BID for 3 days, tylenol prn and I will have him increase his Imdur to 60 mg QD until follow up.  Follow up with me or Dr. Shirlee Latch in 1-2 weeks.  He knows to go to the ED if his symptoms change or worsen.

## 2011-02-09 NOTE — Progress Notes (Signed)
History of Present Illness: Primary Cardiologist:  Dr. Marca Ancona PCP: Dr. Michel Santee, Marc Montes is a 60 y.o. male with a history of CAD, s/p LAD PCI in 11/02. Last summer, patient was getting chest tightness every time he would walk up an incline behind his house. Left heart cath was done in 8/11 given the typical angina, showing a long segment from the LAD ostium throughout the proximal LAD with moderate stenosis (60-70% at most).  To treat this lesion would require either a long segment of overlapping drug-eluting stents (increasing risk of stent thrombosis over time) or a CABG with LIMA-LAD.  It was elected to manage him medically as the patient's symptoms had been only with moderate to heavy exertion.    He was last seen in 4/12.  He was doing well at that time.  Over the last 2-3 days, he has noted right-sided chest discomfort.  He called in to the office today and was added onto my schedule.  The pain is described as a dull ache.  It can come on with activity.  It can come on at rest.  He is able to do activity without causing his chest discomfort. He does have some radiation to his right shoulder and upper arm.  He denies any associated shortness of breath, nausea, diaphoresis, syncope.  He does note some dyspnea with exertion.  This may have been increased over the last several months.  He really describes class II symptoms.  He denies orthopnea, PND or edema.  He denies syncope or near-syncope.  He does note some bilateral leg pain that is related to his neuropathy from a prior back injury.  There are no clear aggravating or alleviating factors.  Labs (7/11): K 5, creatinine 1.1, HCT 41.9, HDL 44, LDL 92, LFTs normal, TSH normal  Labs (8/11): K 4.2, creatinine 0.9  Labs (11/11): K 5.1, creatinine 1.12, LDL 92, HDL 44  Labs (2/12): LDL 57, HDL 45, creatinine 0.97    Past Medical History:  1. CAD: Cath in 11/02 after he developed exertional chest pain and had an abnormal  myoview. This showed 90% proximal LAD stenosis. Patient had BMS to LAD. EF was 55%. LHC (8/11): radial approach, EF 60%< 60-70% ostial LAD, proximal LAD stent with 50-60% in-stent restenosis, ostium of D2 is compressed by the LAD stent with 80% ostial D2 stenosis.  2. Traumatic L1 burst fracture s/p surgery. Chronic back pain with disability.  3. Peripheral neuropathy  4. Hyperlipidemia: simvastatin caused severe myalgias. Tolerated pravastatin and Crestor.    Current Outpatient Prescriptions  Medication Sig Dispense Refill  . aspirin 81 MG tablet Take 81 mg by mouth daily.        . Calcium Carbonate-Vitamin D (RA CALCIUM PLUS VITAMIN D) 600-400 MG-UNIT per tablet Take 1 tablet by mouth daily.        . CRESTOR 10 MG tablet TAKE ONE TABLET BY MOUTH EVERY DAY  30 each  7  . folic acid (FOLVITE) 1 MG tablet Take 1 mg by mouth daily.        Marland Kitchen gabapentin (NEURONTIN) 600 MG tablet Take 600 mg by mouth 3 (three) times daily.        . Glucosamine-Chondroitin (GLUCOSAMINE CHONDR COMPLEX PO) 1 TAB PO QD       . isosorbide mononitrate (IMDUR) 30 MG 24 hr tablet Take 2 tablets (60 mg total) by mouth daily.      . methocarbamol (ROBAXIN) 500 MG tablet Take 500 mg by  mouth as needed.       . metoprolol tartrate (LOPRESSOR) 25 MG tablet TAKE ONE-HALF TABLET BY MOUTH TWICE DAILY  30 tablet  7  . Multiple Vitamin (MULTIVITAMIN) tablet Take 1 tablet by mouth daily.        . niacin 500 MG tablet Take 500 mg by mouth daily with breakfast.        . Omega-3 Fatty Acids (FISH OIL) 1000 MG CAPS Take 1 capsule by mouth daily.        Marland Kitchen oxyCODONE-acetaminophen (PERCOCET) 5-325 MG per tablet Take 1 tablet by mouth every 4 (four) hours as needed.          Allergies: Allergies  Allergen Reactions  . Simvastatin     Social history:  Nonsmoker  ROS:  Please see the history of present illness.  He had some recent myalgias.  All other systems reviewed and negative.   Vital Signs: BP 130/78  Pulse 53  Resp 18  Ht  5\' 8"  (1.727 m)  Wt 188 lb (85.276 kg)  BMI 28.59 kg/m2  PHYSICAL EXAM: Well nourished, well developed, in no acute distress HEENT: normal Neck: no JVD Endocrine: No thyromegaly Vascular: No carotid bruits Cardiac:  normal S1, S2; RRR; no murmur Lungs:  clear to auscultation bilaterally, no wheezing, rhonchi or rales Abd: soft, nontender, no hepatomegaly Ext: no edema Skin: warm and dry Neuro:  CNs 2-12 intact, no focal abnormalities noted Psych: Normal affect  EKG:  Sinus bradycardia, rate 53, normal axis, nonspecific ST-T wave changes  ASSESSMENT AND PLAN:

## 2011-02-09 NOTE — Patient Instructions (Signed)
Your physician recommends that you schedule a follow-up appointment in: 1-2 WEEKS WITH DR. Shirlee Latch AS PER SCOTT WEAVER, PA-C OR WITH SCOTT WEAVER, PA-C SAME DAY DR. Shirlee Latch IS IN THE OFFICE.  Your physician has requested that you have en exercise stress myoview DX [phone] PLEASE SCHEDULE THIS FOR 1 WEEK. For further information please visit [email or web address] Please follow instruction sheet, as given.  YOU MAY TAKE ALEVE 1 TABLET TWICE DAILY FOR 3 DAYS  YOU MAY ALSO TAKE TYLENOL AS NEEDED  Your physician has recommended you make the following change in your medication: INCREASE ISOSORBIDE TO 60 MG DAILY

## 2011-02-09 NOTE — Telephone Encounter (Signed)
Per pt wife calling, pt said he is not experiencing chest pain right now but has been been experiencing chest pain within the last week. Call was transferred to Mylo Red.

## 2011-02-14 ENCOUNTER — Encounter (HOSPITAL_COMMUNITY): Payer: Medicare Other | Admitting: Radiology

## 2011-02-22 ENCOUNTER — Ambulatory Visit: Payer: Medicare Other | Admitting: Cardiology

## 2011-02-23 ENCOUNTER — Ambulatory Visit: Payer: Medicare Other | Admitting: Physician Assistant

## 2011-03-15 ENCOUNTER — Ambulatory Visit (INDEPENDENT_AMBULATORY_CARE_PROVIDER_SITE_OTHER): Payer: Medicare Other | Admitting: Cardiology

## 2011-03-15 ENCOUNTER — Encounter: Payer: Self-pay | Admitting: Cardiology

## 2011-03-15 DIAGNOSIS — I2510 Atherosclerotic heart disease of native coronary artery without angina pectoris: Secondary | ICD-10-CM

## 2011-03-15 DIAGNOSIS — E785 Hyperlipidemia, unspecified: Secondary | ICD-10-CM

## 2011-03-15 NOTE — Progress Notes (Signed)
PCP: Dr. Michel Santee, Newt Lukes  60 yo with history of CAD s/p LAD PCI in 11/02 returns for cardiology followup.  In the summer of 2011, patient was getting chest tightness every time he would walk up an incline behind his house.  Left heart cath was done in 8/11 given the typical angina, showing a long segment from the LAD ostium throughout the proximal LAD with moderate stenosis (60-70% at most). To treat this lesion would require either a long segment of overlapping drug-eluting stents (increasing risk of stent thrombosis over time) or a CABG with LIMA-LAD.  We elected to manage medically as the patient's symptoms have been only with moderate to heavy exertion.    About a month ago, patient developed nonexertional right-sided chest pain.  He was seen by Tereso Newcomer and set up for ETT-myoview.  The right-sided chest pain resolved shortly thereafter and has not returned.  He cancelled the myoview.  Since then, he has been doing well.  He has had no exertional chest pain.  Prior angina was central chest pain with exertion.  He is able to walk on flat ground, weed-eat, and do most yardwork with no exertional dyspnea.  He is mildly short of breath climbing up a hill.    Labs (7/11): K 5, creatinine 1.1, HCT 41.9, HDL 44, LDL 92, LFTs normal, TSH normal Labs (8/11): K 4.2, creatinine 0.9 Labs (11/11): K 5.1, creatinine 1.12, LDL 92, HDL 44 Labs (2/12): LDL 57, HDL 45, creatinine 0.97  ECG: NSR, normal  Allergies (verified):  1)  ! Zocor (myalgias)  Past Medical History: 1. CAD: Cath in 11/02 after he developed exertional chest pain and had an abnormal myoview.  This showed 90% proximal LAD stenosis.  Patient had BMS to LAD.  EF was 55%.  LHC (8/11): radial approach, EF 60%< 60-70% ostial LAD, proximal LAD stent with 50-60% in-stent restenosis, ostium of D2 is compressed by the LAD stent with 80% ostial D2 stenosis.   2. Traumatic L1 burst fracture s/p surgery.  Chronic back pain with disability. 3.  Peripheral neuropathy 4. Hyperlipidemia: simvastatin caused severe myalgias. Tolerated pravastatin and Crestor.   Family History: Noncontributory.   Social History: Retired Curator, disability from back injury.  Married-Lives outside of [place] Alcohol Use - no Tobacco Use - No.  Smoking Status:  Prior.  Current Outpatient Prescriptions  Medication Sig Dispense Refill  . aspirin 81 MG tablet Take 81 mg by mouth daily.        . Calcium Carbonate-Vitamin D (RA CALCIUM PLUS VITAMIN D) 600-400 MG-UNIT per tablet Take 1 tablet by mouth daily.        . CRESTOR 10 MG tablet TAKE ONE TABLET BY MOUTH EVERY DAY  30 each  7  . folic acid (FOLVITE) 1 MG tablet Take 1 mg by mouth daily.        Marland Kitchen gabapentin (NEURONTIN) 600 MG tablet Take 600 mg by mouth 3 (three) times daily.        . isosorbide mononitrate (IMDUR) 30 MG 24 hr tablet Take 2 tablets (60 mg total) by mouth daily.      . methocarbamol (ROBAXIN) 500 MG tablet Take 500 mg by mouth as needed.       . Methylsulfonylmethane (MSM) 500 MG CAPS Take 1 capsule by mouth daily.        . metoprolol tartrate (LOPRESSOR) 25 MG tablet TAKE ONE-HALF TABLET BY MOUTH TWICE DAILY  30 tablet  7  . Multiple Vitamin (MULTIVITAMIN) tablet Take 1  tablet by mouth daily.        . niacin 500 MG tablet Take 500 mg by mouth daily with breakfast.        . Omega-3 Fatty Acids (FISH OIL) 1000 MG CAPS Take 1 capsule by mouth daily.        Marland Kitchen oxyCODONE-acetaminophen (PERCOCET) 5-325 MG per tablet Take 1 tablet by mouth every 4 (four) hours as needed.          BP 112/68  Pulse 51  Ht 5\' 8"  (1.727 m)  Wt 180 lb 12.8 oz (82.01 kg)  BMI 27.49 kg/m2 General: NAD Neck: No JVD, no thyromegaly or thyroid nodule.  Lungs: Clear to auscultation bilaterally with normal respiratory effort. CV: Nondisplaced PMI.  Heart regular S1/S2, no S3/S4, no murmur.  No peripheral edema.  No carotid bruit.  Normal pedal pulses.  Abdomen: Soft, nontender, no hepatosplenomegaly, no  distention.   Neurologic: Alert and oriented x 3.  Psych: Normal affect. Extremities: No clubbing or cyanosis.

## 2011-03-15 NOTE — Patient Instructions (Signed)
Your physician recommends that you have  a FASTING lipid profile/liver profile/BMP  414.01  272.0--you have the order. Please fax the results to [phone] Dr Shirlee Latch.  Your physician wants you to follow-up in: 6 months with Dr Shirlee Latch.(March 2013). You will receive a reminder letter in the mail two months in advance. If you don't receive a letter, please call our office to schedule the follow-up appointment.

## 2011-03-15 NOTE — Assessment & Plan Note (Signed)
I will arrange for him to get lipids/LFTs.  Goal LDL < 70.

## 2011-03-15 NOTE — Assessment & Plan Note (Signed)
Patient's anginal symptoms probably originated from the long, moderate (60-70%) ostial to proximal LAD stenosis.  The symptoms were likely a function of the length of the stenosed segment rather than the absolute degree of stenosis.  Percutaneous intervention would require a long segment of overlapping drug eluting stents from the LAD ostium throughout the proximal LAD. This would put the patient at higher risk of stent thrombosis down the road.  The other option would be a LIMA-LAD.  We have managed him medically given stable symptoms.  He has had no further angina on metoprolol and Imdur.  I think that the nonexertional right-sided chest pain he had last month was likely non-cardiac.   - Continue ASA, statin, Imdur, and metoprolol.

## 2011-05-01 ENCOUNTER — Ambulatory Visit: Payer: Medicare Other | Admitting: Cardiology

## 2011-05-02 ENCOUNTER — Other Ambulatory Visit: Payer: Self-pay | Admitting: Cardiology

## 2011-07-04 IMAGING — CR DG CHEST 2V
2 series · 2 of 2 positions shown · non-contrast
Comparison: None.

CLINICAL DATA: Cough for 1 month.  Recent bronchoscopy.  Nonsmoker.

CHEST - 2 VIEW

[w chest pa]
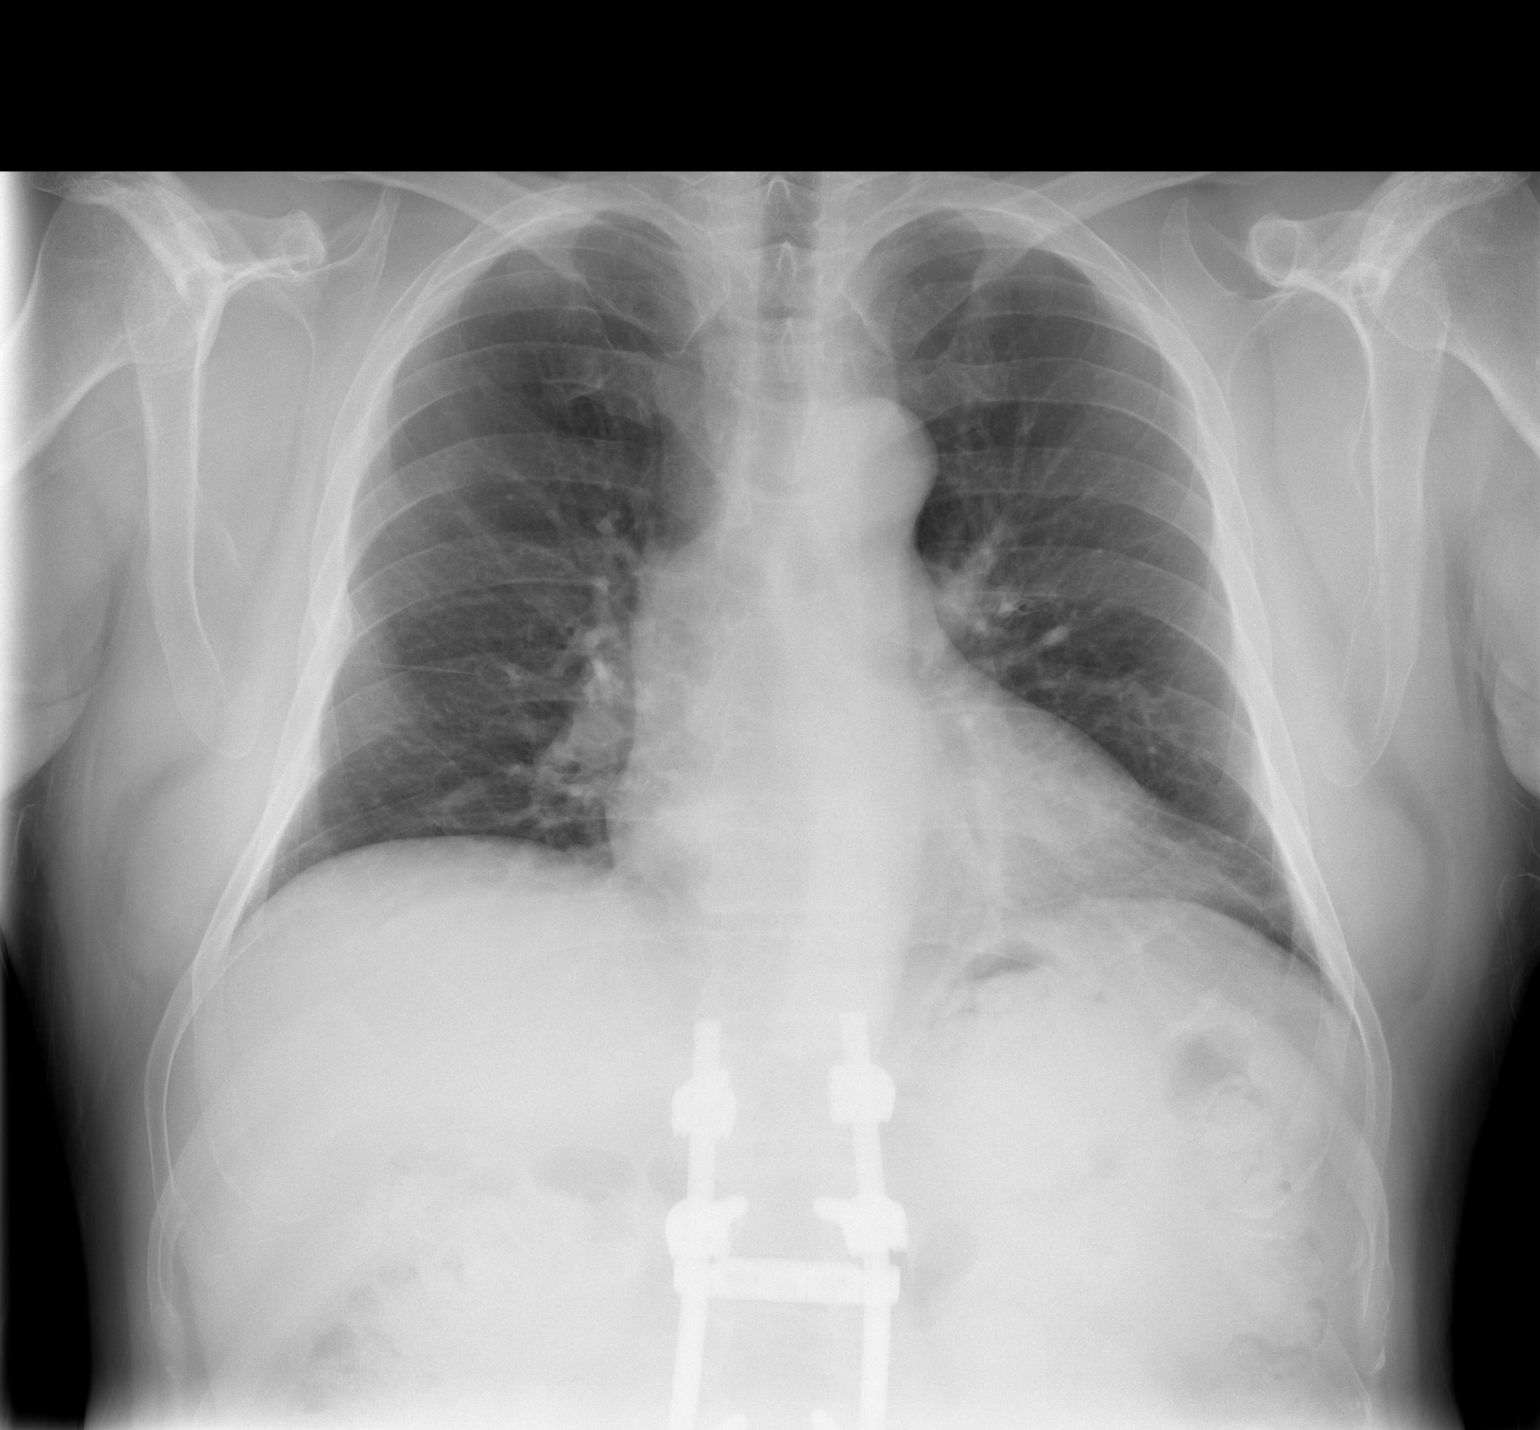

[w chest lat]
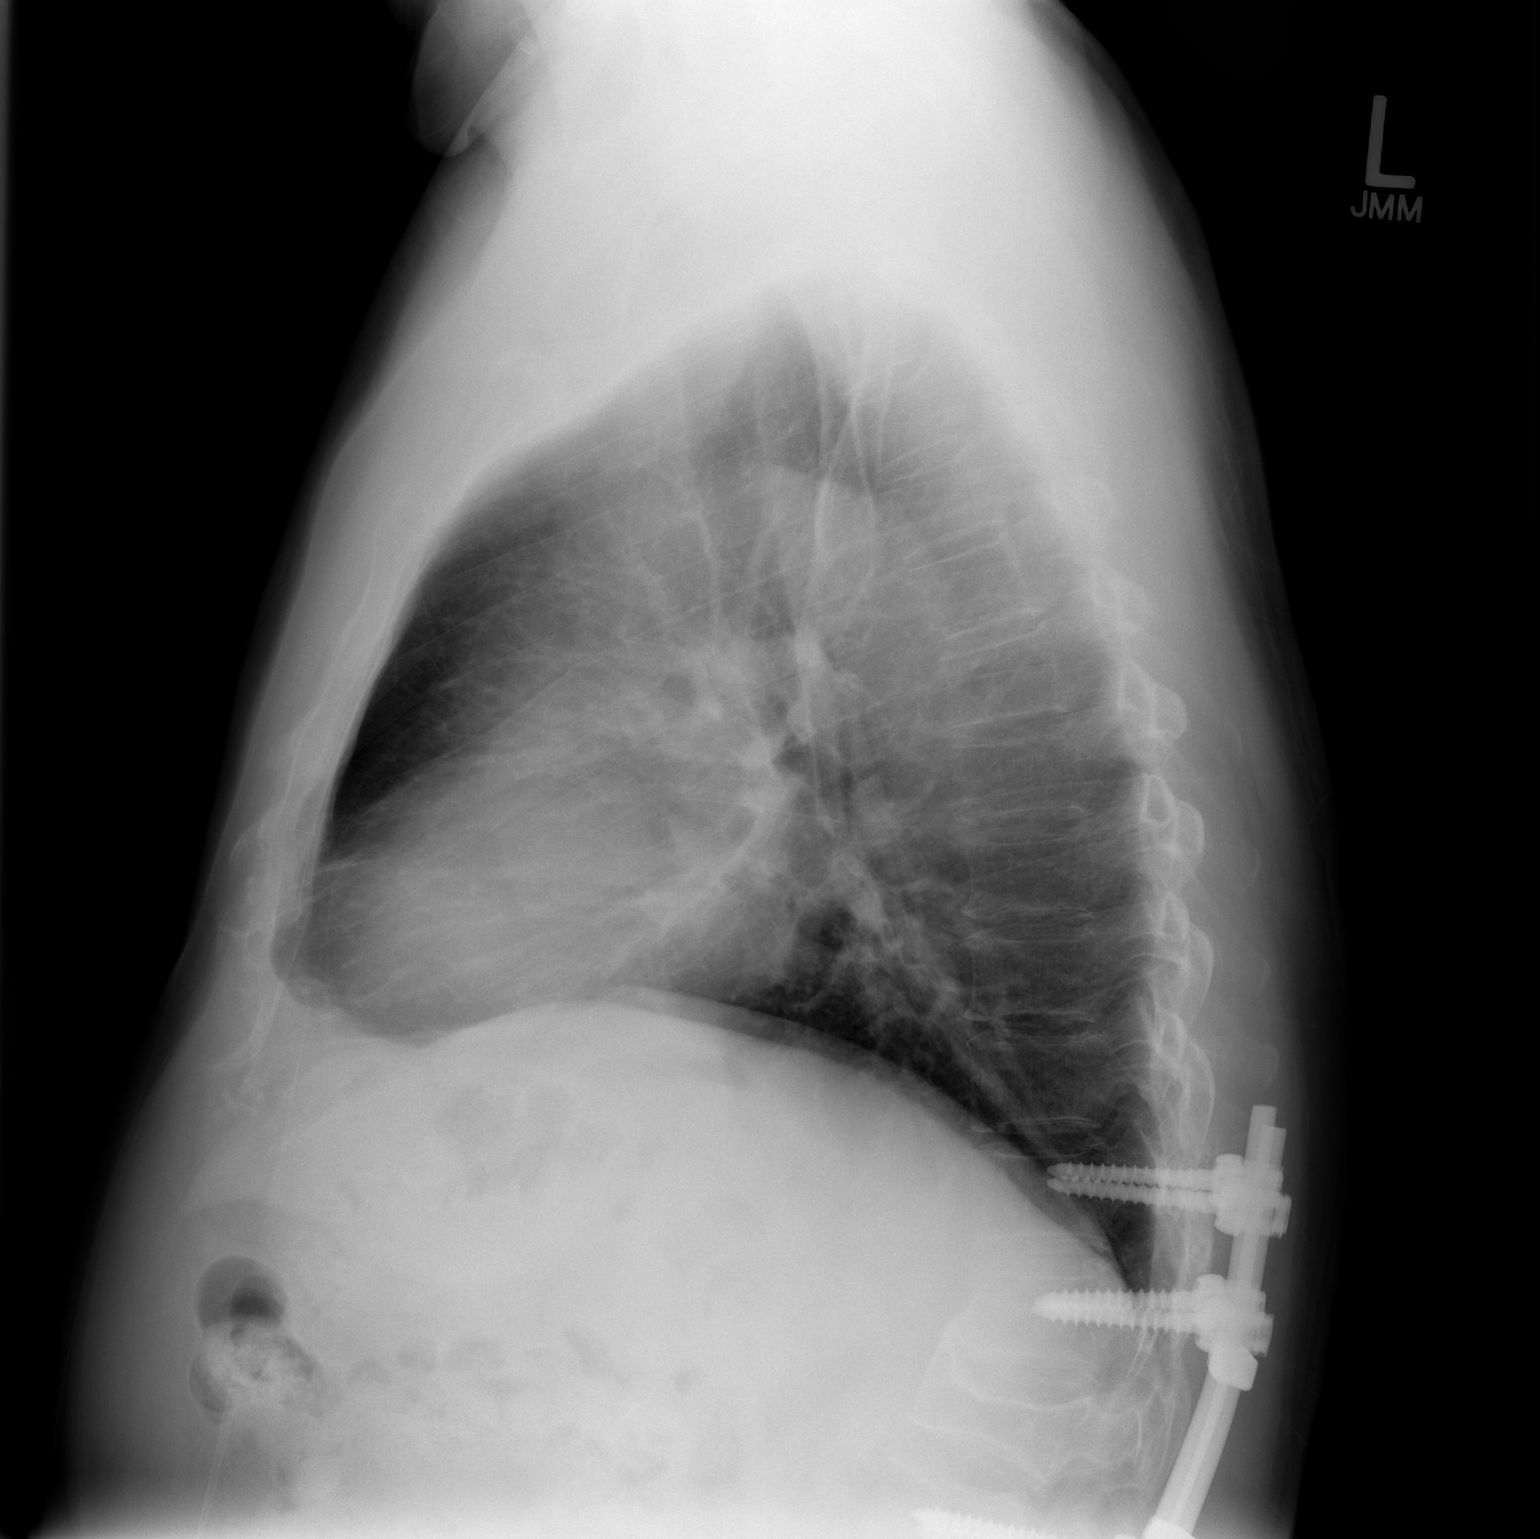

[2 of 2 positions shown; findings below may reference images not displayed]

FINDINGS: The heart size and mediastinal contours are normal.
There is minimal scarring or atelectasis in both lung bases.  No
edema, confluent airspace opacity or pleural effusion is
demonstrated.  There are old right-sided rib fractures.  There has
been previous thoracolumbar fusion for an L1 burst fracture.
IMPRESSION: No acute cardiopulmonary process.  Mild basilar scarring or
atelectasis.

## 2011-10-27 ENCOUNTER — Other Ambulatory Visit: Payer: Self-pay | Admitting: Cardiology

## 2011-10-28 ENCOUNTER — Other Ambulatory Visit: Payer: Self-pay | Admitting: Cardiology

## 2011-12-23 ENCOUNTER — Other Ambulatory Visit: Payer: Self-pay | Admitting: Cardiology

## 2012-01-26 ENCOUNTER — Other Ambulatory Visit: Payer: Self-pay | Admitting: Cardiology

## 2012-02-08 ENCOUNTER — Other Ambulatory Visit: Payer: Self-pay | Admitting: Cardiology

## 2012-02-08 MED ORDER — METOPROLOL TARTRATE 25 MG PO TABS: 25.0000 mg | ORAL_TABLET | Freq: Every day | ORAL | Status: DC

## 2012-02-28 ENCOUNTER — Ambulatory Visit (INDEPENDENT_AMBULATORY_CARE_PROVIDER_SITE_OTHER): Payer: Medicare Other | Admitting: Cardiology

## 2012-02-28 ENCOUNTER — Encounter: Payer: Self-pay | Admitting: Cardiology

## 2012-02-28 VITALS — BP 132/68 | HR 49 | Ht 68.0 in | Wt 181.0 lb

## 2012-02-28 DIAGNOSIS — E785 Hyperlipidemia, unspecified: Secondary | ICD-10-CM

## 2012-02-28 DIAGNOSIS — I2510 Atherosclerotic heart disease of native coronary artery without angina pectoris: Secondary | ICD-10-CM

## 2012-02-28 MED ORDER — ROSUVASTATIN CALCIUM 10 MG PO TABS: 10.0000 mg | ORAL_TABLET | Freq: Every day | ORAL | Status: DC

## 2012-02-28 MED ORDER — ISOSORBIDE MONONITRATE ER 30 MG PO TB24: 30.0000 mg | ORAL_TABLET | Freq: Every day | ORAL | Status: DC

## 2012-02-28 MED ORDER — METOPROLOL TARTRATE 25 MG PO TABS: ORAL_TABLET | ORAL | Status: DC

## 2012-02-28 NOTE — Progress Notes (Signed)
Patient ID: Marc Montes, male   DOB: April 16, 1951, 61 y.o.   MRN: [id number] PCP: Dr. Michel Santee, [facility]  61 yo with history of CAD s/p LAD PCI in 11/02 returns for cardiology followup.  Last summer, patient was getting chest tightness every time he would walk up an incline behind his house.  Left heart cath was done in 8/11 given the typical angina, showing a long segment from the LAD ostium throughout the proximal LAD with moderate stenosis (60-70% at most). To treat this lesion would require either a long segment of overlapping drug-eluting stents (increasing risk of stent thrombosis over time) or a CABG with LIMA-LAD.  We elected to manage medically as the patient's symptoms had been only with moderate to heavy exertion.    Patient was seen earlier this year by the PA for fairly atypical right-sided chest pain.  He was set up for a stress test but the pain resolved and he never had it done.  Patient has been doing well recently.  He is on metoprolol and Imdur for angina.  Since I last saw him a year ago, he has had only 2 episodes of chest pain, none recently.  He is quite active and does a lot of heavy yardwork.  He is able to walk up hill and carry heavy loads (cinder blocks, etc) with no exertional dyspnea or chest pain.  Main limitation currently is his back pain.  This now radiates down his legs.    Labs (7/11): K 5, creatinine 1.1, HCT 41.9, HDL 44, LDL 92, LFTs normal, TSH normal Labs (8/11): K 4.2, creatinine 0.9 Labs (11/11): K 5.1, creatinine 1.12, LDL 92, HDL 44 Labs (2/12): LDL 57, HDL 45, creatinine 0.97  Allergies (verified):  1)  ! Zocor (myalgias)  Past History:  Past Medical History: 1. CAD: Cath in 11/02 after he developed exertional chest pain and had an abnormal myoview.  This showed 90% proximal LAD stenosis.  Patient had BMS to LAD.  EF was 55%.  LHC (8/11): radial approach, EF 60%< 60-70% ostial LAD, proximal LAD stent with 50-60% in-stent restenosis, ostium of D2 is  compressed by the LAD stent with 80% ostial D2 stenosis.   2. Traumatic L1 burst fracture s/p surgery.  Chronic back pain with disability. 3. Peripheral neuropathy 4. Hyperlipidemia: simvastatin caused severe myalgias. Tolerated pravastatin and Crestor.   Family History: Noncontributory.   Social History: Retired Curator, disability from back injury.  Married-Lives outside of [place] Alcohol Use - no Tobacco Use - No.  Smoking Status:  Prior.  Current Outpatient Prescriptions  Medication Sig Dispense Refill  . aspirin 81 MG tablet Take 81 mg by mouth daily.        . Calcium Carbonate-Vitamin D (RA CALCIUM PLUS VITAMIN D) 600-400 MG-UNIT per tablet Take 1 tablet by mouth daily.        . folic acid (FOLVITE) 1 MG tablet Take 1 mg by mouth daily.        Marland Kitchen gabapentin (NEURONTIN) 600 MG tablet Take 600 mg by mouth 3 (three) times daily.        . isosorbide mononitrate (IMDUR) 30 MG 24 hr tablet Take 1 tablet (30 mg total) by mouth daily.  90 tablet  3  . methocarbamol (ROBAXIN) 500 MG tablet Take 500 mg by mouth as needed.       . Methylsulfonylmethane (MSM) 500 MG CAPS Take 1 capsule by mouth daily.        . metoprolol tartrate (LOPRESSOR) 25 MG  tablet 1/2 tablet (total 12.5mg )  two times a day  90 tablet  3  . Multiple Vitamin (MULTIVITAMIN) tablet Take 1 tablet by mouth daily.        . niacin 500 MG tablet Take 500 mg by mouth daily with breakfast.        . Omega-3 Fatty Acids (FISH OIL) 1000 MG CAPS Take 1 capsule by mouth daily.        Marland Kitchen oxyCODONE-acetaminophen (PERCOCET) 5-325 MG per tablet Take 1 tablet by mouth every 4 (four) hours as needed.        . rosuvastatin (CRESTOR) 10 MG tablet Take 1 tablet (10 mg total) by mouth daily.  90 tablet  3  . DISCONTD: CRESTOR 10 MG tablet TAKE ONE TABLET BY MOUTH EVERY DAY  30 each  5  . DISCONTD: isosorbide mononitrate (IMDUR) 30 MG 24 hr tablet TAKE TWO TABLETS BY MOUTH EVERY DAY  60 tablet  5  . DISCONTD: isosorbide mononitrate  (IMDUR) 30 MG 24 hr tablet Take 1 tablet (30 mg total) by mouth daily.      Marland Kitchen DISCONTD: metoprolol tartrate (LOPRESSOR) 25 MG tablet Take 1 tablet (25 mg total) by mouth daily.  30 tablet  4  . DISCONTD: metoprolol tartrate (LOPRESSOR) 25 MG tablet Take 12.5 mg by mouth 2 (two) times daily.      Marland Kitchen DISCONTD: metoprolol tartrate (LOPRESSOR) 25 MG tablet 1/2 tablet (total 12.5mg )  two times a day      . DISCONTD: isosorbide mononitrate (IMDUR) 30 MG 24 hr tablet Take 2 tablets (60 mg total) by mouth daily.        BP 132/68  Pulse 49  Ht 5\' 8"  (1.727 m)  Wt 181 lb (82.101 kg)  BMI 27.52 kg/m2 General: NAD Neck: No JVD, no thyromegaly or thyroid nodule.  Lungs: Clear to auscultation bilaterally with normal respiratory effort. CV: Nondisplaced PMI.  Heart regular S1/S2, no S3/S4, no murmur.  No peripheral edema.  No carotid bruit.  Normal pedal pulses.  Abdomen: Soft, nontender, no hepatosplenomegaly, no distention.   Neurologic: Alert and oriented x 3.  Psych: Normal affect. Extremities: No clubbing or cyanosis.

## 2012-02-28 NOTE — Assessment & Plan Note (Signed)
He will have lipids drawn in [place] and will fax a copy here.  Goal LDL < 70.

## 2012-02-28 NOTE — Assessment & Plan Note (Signed)
Patient's anginal symptoms probably originated from the long, moderate (60-70%) ostial to proximal LAD stenosis.  The symptoms were likely a function of the length of the stenosed segment rather than the absolute degree of stenosis.  Percutaneous intervention would require a long segment of overlapping drug eluting stents from the LAD ostium throughout the proximal LAD. This would put the patient at higher risk of stent thrombosis down the road.  The other option would be a LIMA-LAD.  We have managed him medically and symptoms have been stable.  He has had only 2 episodes of chest pain in the last year while on metoprolol and Imdur.  If he develops significant chest pain concerning for progressive angina, he would need cath followed by consideration of PCI versus CABG. - Continue ASA, statin, Imdur, and metoprolol.

## 2012-02-28 NOTE — Patient Instructions (Addendum)
Your physician recommends that you have a FASTING lipid profile /BMET--you have the order. Please fax the results to Dr Shirlee Latch. [phone]  Your physician wants you to follow-up in: 6 months with Dr Shirlee Latch. (February 2014) You will receive a reminder letter in the mail two months in advance. If you don't receive a letter, please call our office to schedule the follow-up appointment.

## 2012-03-08 NOTE — Addendum Note (Signed)
Addended by: Micki Riley C on: 03/08/2012 04:11 PM   Modules accepted: Orders

## 2012-03-15 ENCOUNTER — Telehealth: Payer: Self-pay | Admitting: Cardiology

## 2012-03-15 MED ORDER — METOPROLOL TARTRATE 25 MG PO TABS: ORAL_TABLET | ORAL | Status: DC

## 2012-03-15 NOTE — Telephone Encounter (Signed)
Please return call to patient wife [phone] to discuss medication

## 2012-04-03 ENCOUNTER — Telehealth: Payer: Self-pay | Admitting: Cardiology

## 2012-04-03 NOTE — Telephone Encounter (Signed)
New problem:  Upcoming colonoscopy additional  information regarding cardiac clearance .

## 2012-04-03 NOTE — Telephone Encounter (Signed)
Spoke with Marc Montes. She received faxed OK for colonoscopy form completed by Dr Shirlee Latch. She is requesting medical records. I will forward to HIM.

## 2012-04-05 NOTE — Telephone Encounter (Signed)
Per Merita Norton they need more detailed information regarding why it is ok for pt to have this done

## 2012-04-07 NOTE — Telephone Encounter (Signed)
I'm not sure what more information they want or need.  There is no cardiac contraindication to colonoscopy.  He is not on coumadin or Plavix.

## 2012-04-08 NOTE — Telephone Encounter (Signed)
I will forward to HIM follow-up with them about other medical records they may need.

## 2013-05-28 ENCOUNTER — Other Ambulatory Visit: Payer: Self-pay | Admitting: Cardiology

## 2013-08-23 ENCOUNTER — Other Ambulatory Visit: Payer: Self-pay | Admitting: Cardiology

## 2013-08-29 ENCOUNTER — Other Ambulatory Visit: Payer: Self-pay | Admitting: Cardiology

## 2013-09-01 ENCOUNTER — Other Ambulatory Visit: Payer: Self-pay

## 2013-09-01 MED ORDER — ROSUVASTATIN CALCIUM 10 MG PO TABS: ORAL_TABLET | ORAL | Status: DC

## 2013-09-13 ENCOUNTER — Other Ambulatory Visit: Payer: Self-pay | Admitting: Cardiology

## 2013-09-19 ENCOUNTER — Other Ambulatory Visit: Payer: Self-pay | Admitting: Cardiology

## 2013-10-16 ENCOUNTER — Other Ambulatory Visit: Payer: Self-pay | Admitting: Cardiology

## 2013-10-26 ENCOUNTER — Other Ambulatory Visit: Payer: Self-pay | Admitting: Cardiology

## 2013-10-27 ENCOUNTER — Encounter: Payer: Self-pay | Admitting: *Deleted

## 2013-10-27 ENCOUNTER — Encounter: Payer: Self-pay | Admitting: Cardiology

## 2013-10-27 ENCOUNTER — Ambulatory Visit (INDEPENDENT_AMBULATORY_CARE_PROVIDER_SITE_OTHER): Payer: Medicare Other | Admitting: Cardiology

## 2013-10-27 VITALS — BP 140/80 | HR 52 | Ht 68.5 in | Wt 187.8 lb

## 2013-10-27 DIAGNOSIS — R079 Chest pain, unspecified: Secondary | ICD-10-CM

## 2013-10-27 DIAGNOSIS — E785 Hyperlipidemia, unspecified: Secondary | ICD-10-CM

## 2013-10-27 DIAGNOSIS — I2510 Atherosclerotic heart disease of native coronary artery without angina pectoris: Secondary | ICD-10-CM

## 2013-10-27 MED ORDER — ROSUVASTATIN CALCIUM 10 MG PO TABS: ORAL_TABLET | ORAL | Status: DC

## 2013-10-27 MED ORDER — METOPROLOL TARTRATE 25 MG PO TABS: 12.5000 mg | ORAL_TABLET | Freq: Two times a day (BID) | ORAL | Status: DC

## 2013-10-27 MED ORDER — ISOSORBIDE MONONITRATE ER 60 MG PO TB24: ORAL_TABLET | ORAL | Status: DC

## 2013-10-27 NOTE — Patient Instructions (Signed)
Increase Imdur(isosorbide) to 90mg  daily. This will be 1 and 1/2 of a 60mg  tablet.  Your physician has requested that you have en exercise stress myoview. For further information please visit [email or web address]. Please follow instruction sheet, as given.  Your physician recommends that you return for a FASTING lipid profile /BMET.   Your physician wants you to follow-up in: 6 months with Dr Aundra Dubin. (October 2015).  You will receive a reminder letter in the mail two months in advance. If you don't receive a letter, please call our office to schedule the follow-up appointment.

## 2013-10-28 NOTE — Progress Notes (Signed)
Patient ID: Marc Montes, male   DOB: 06-23-1951, 63 y.o.   MRN: [id number] PCP: Dr. Elvina Mattes, [facility]  63 yo with history of CAD s/p LAD PCI in 11/02 returns for cardiology followup.  Left heart cath was done in 8/11 given typical angina, showing a long segment from the LAD ostium throughout the proximal LAD with moderate stenosis (60-70% at most). To treat this lesion would require either a long segment of overlapping drug-eluting stents (increasing risk of stent thrombosis over time) or a CABG with LIMA-LAD.  We elected to manage medically as the patient's symptoms had been only with moderate to heavy exertion.    I have not seen the patient since 2013.  He reports some increase in his chest pain pattern.  He gets central chest tightness and dyspnea after pushing or pulling something heavy or if he walks fast up a hill.  He will have about a monthly episode of chest pain resolving with rest with one of the above strenuous activities.  No syncope or palpitations.  He is overall quite active.     Labs (7/11): K 5, creatinine 1.1, HCT 41.9, HDL 44, LDL 92, LFTs normal, TSH normal Labs (8/11): K 4.2, creatinine 0.9 Labs (11/11): K 5.1, creatinine 1.12, LDL 92, HDL 44 Labs (2/12): LDL 57, HDL 45, creatinine 0.97  ECG: NSR, Q in III  Allergies (verified):  1)  ! Zocor (myalgias)  Past History:  Past Medical History: 1. CAD: Cath in 11/02 after he developed exertional chest pain and had an abnormal myoview.  This showed 90% proximal LAD stenosis.  Patient had BMS to LAD.  EF was 55%.  LHC (8/11): radial approach, EF 60%< 60-70% ostial LAD, proximal LAD stent with 50-60% in-stent restenosis, ostium of D2 is compressed by the LAD stent with 80% ostial D2 stenosis.   2. Traumatic L1 burst fracture s/p surgery.  Chronic back pain with disability. 3. Peripheral neuropathy 4. Hyperlipidemia: simvastatin caused severe myalgias. Tolerated pravastatin and Crestor.   Family History: No CAD  Social  History: Retired Dealer, disability from back injury.  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  Smoking Status:  Prior.  ROS: All systems reviewed and negative except as per HPI.   Current Outpatient Prescriptions  Medication Sig Dispense Refill  . aspirin 81 MG tablet Take 81 mg by mouth daily.        . Calcium Carbonate-Vitamin D (RA CALCIUM PLUS VITAMIN D) 600-400 MG-UNIT per tablet Take 1 tablet by mouth daily.        . Cholecalciferol (VITAMIN D-3 PO) Take 1 tablet by mouth daily.      Marland Kitchen gabapentin (NEURONTIN) 600 MG tablet Take 600 mg by mouth 3 (three) times daily.        Marland Kitchen GLUCOSAMINE HCL-MSM PO Take 1 tablet by mouth daily.      . magnesium oxide (MAG-OX) 400 MG tablet Take 400 mg by mouth daily.      . methocarbamol (ROBAXIN) 500 MG tablet Take 500 mg by mouth as needed.       . Multiple Vitamin (MULTIVITAMIN) tablet Take 1 tablet by mouth daily.        Marland Kitchen oxyCODONE-acetaminophen (PERCOCET) 5-325 MG per tablet Take 1 tablet by mouth every 4 (four) hours as needed.        . rosuvastatin (CRESTOR) 10 MG tablet TAKE ONE TABLET BY MOUTH ONCE DAILY  30 tablet  6  . tamsulosin (FLOMAX) 0.4 MG CAPS  capsule Take 0.4 mg by mouth daily.      . isosorbide mononitrate (IMDUR) 60 MG 24 hr tablet 1 and 1/2 tablets (total 90mg ) daily  45 tablet  6  . metoprolol tartrate (LOPRESSOR) 25 MG tablet Take 0.5 tablets (12.5 mg total) by mouth 2 (two) times daily.  30 tablet  6   No current facility-administered medications for this visit.    BP 140/80  Pulse 52  Ht 5' 8.5" (1.74 m)  Wt 85.186 kg (187 lb 12.8 oz)  BMI 28.14 kg/m2 General: NAD Neck: No JVD, no thyromegaly or thyroid nodule.  Lungs: Clear to auscultation bilaterally with normal respiratory effort. CV: Nondisplaced PMI.  Heart regular S1/S2, no S3/S4, no murmur.  No peripheral edema.  No carotid bruit.  Normal pedal pulses.  Abdomen: Soft, nontender, no hepatosplenomegaly, no distention.    Neurologic: Alert and oriented x 3.  Psych: Normal affect. Extremities: No clubbing or cyanosis.   Assessment/Plan: 1. CAD: Patient had a long moderate proximal LAD stenosis on last cath in 8/11 that was managed medically.  He has a chronic stable angina pattern that has worsened incrementally over the last year.  He still only has symptoms with relatively strenuous exertion.   - I will arrange for ETT-cardiolite.  - Increase Imdur to 90 mg daily.  - If Cardiolite is low risk, followup in 6 months.  2. Hyperlipidemia: I will arrange for lipid profile fasting at time of stress test.   Larey Dresser 10/28/2013

## 2013-11-06 ENCOUNTER — Telehealth: Payer: Self-pay | Admitting: Cardiology

## 2013-11-06 NOTE — Telephone Encounter (Deleted)
error 

## 2013-11-17 ENCOUNTER — Encounter (HOSPITAL_COMMUNITY): Payer: Medicare Other

## 2013-11-17 ENCOUNTER — Other Ambulatory Visit: Payer: Medicare Other

## 2014-04-14 NOTE — Telephone Encounter (Signed)
error 

## 2014-06-01 ENCOUNTER — Telehealth: Payer: Self-pay | Admitting: Cardiology

## 2014-06-01 NOTE — Telephone Encounter (Signed)
Spoke with patient and advised him that we would check with you re: need for antibiotic prophylaxis.

## 2014-06-01 NOTE — Telephone Encounter (Signed)
Patient informed. 

## 2014-06-01 NOTE — Telephone Encounter (Signed)
No antibiotics needed.

## 2014-06-01 NOTE — Telephone Encounter (Signed)
New Msg  Patient calling states he will be having a minor lip surgery to remove a cancer cell and he would like to know if he has to take an antibiotic first. Please contact at [phone].

## 2014-06-06 ENCOUNTER — Other Ambulatory Visit: Payer: Self-pay | Admitting: Cardiology

## 2014-06-06 NOTE — Telephone Encounter (Signed)
Rx was sent to pharmacy electronically. 

## 2014-06-08 ENCOUNTER — Ambulatory Visit: Payer: Medicare Other | Admitting: Cardiology

## 2014-07-03 DIAGNOSIS — C430 Malignant melanoma of lip: Secondary | ICD-10-CM

## 2014-07-03 HISTORY — DX: Malignant melanoma of lip: C43.0

## 2014-07-03 HISTORY — PX: MELANOMA EXCISION: SHX5266

## 2014-07-12 ENCOUNTER — Other Ambulatory Visit: Payer: Self-pay | Admitting: Cardiology

## 2014-08-18 ENCOUNTER — Other Ambulatory Visit: Payer: Self-pay | Admitting: Cardiology

## 2014-09-15 ENCOUNTER — Other Ambulatory Visit: Payer: Self-pay | Admitting: Cardiology

## 2014-09-21 ENCOUNTER — Other Ambulatory Visit: Payer: Self-pay | Admitting: Cardiology

## 2014-10-16 ENCOUNTER — Telehealth: Payer: Self-pay

## 2014-10-16 ENCOUNTER — Other Ambulatory Visit: Payer: Self-pay

## 2014-10-16 MED ORDER — NITROGLYCERIN 0.4 MG SL SUBL: SUBLINGUAL_TABLET | SUBLINGUAL | Status: DC

## 2014-10-16 NOTE — Telephone Encounter (Signed)
May refill NTG

## 2014-11-16 ENCOUNTER — Other Ambulatory Visit: Payer: Self-pay | Admitting: Cardiology

## 2015-01-07 ENCOUNTER — Encounter: Payer: Self-pay | Admitting: Cardiology

## 2015-01-07 ENCOUNTER — Ambulatory Visit (INDEPENDENT_AMBULATORY_CARE_PROVIDER_SITE_OTHER): Payer: Medicare Other | Admitting: Cardiology

## 2015-01-07 VITALS — BP 126/62 | HR 53 | Ht 68.45 in | Wt 182.0 lb

## 2015-01-07 DIAGNOSIS — R55 Syncope and collapse: Secondary | ICD-10-CM

## 2015-01-07 DIAGNOSIS — M791 Myalgia, unspecified site: Secondary | ICD-10-CM | POA: Insufficient documentation

## 2015-01-07 DIAGNOSIS — E7800 Pure hypercholesterolemia, unspecified: Secondary | ICD-10-CM | POA: Insufficient documentation

## 2015-01-07 DIAGNOSIS — I2510 Atherosclerotic heart disease of native coronary artery without angina pectoris: Secondary | ICD-10-CM | POA: Diagnosis not present

## 2015-01-07 DIAGNOSIS — R799 Abnormal finding of blood chemistry, unspecified: Secondary | ICD-10-CM | POA: Insufficient documentation

## 2015-01-07 DIAGNOSIS — G629 Polyneuropathy, unspecified: Secondary | ICD-10-CM | POA: Insufficient documentation

## 2015-01-07 DIAGNOSIS — K644 Residual hemorrhoidal skin tags: Secondary | ICD-10-CM | POA: Insufficient documentation

## 2015-01-07 DIAGNOSIS — M79609 Pain in unspecified limb: Secondary | ICD-10-CM | POA: Insufficient documentation

## 2015-01-07 LAB — LIPID PANEL
Cholesterol: 112 mg/dL (ref 0–200)
HDL: 36.2 mg/dL — AB (ref >39.00)
LDL CALC: 48 mg/dL (ref 0–99)
NonHDL: 75.8
TRIGLYCERIDES: 140 mg/dL (ref 0.0–149.0)
Total CHOL/HDL Ratio: 3
VLDL: 28 mg/dL (ref 0.0–40.0)

## 2015-01-07 LAB — BASIC METABOLIC PANEL
BUN: 12 mg/dL (ref 6–23)
CHLORIDE: 105 meq/L (ref 96–112)
CO2: 29 mEq/L (ref 19–32)
CREATININE: 1.06 mg/dL (ref 0.40–1.50)
Calcium: 9 mg/dL (ref 8.4–10.5)
GFR: 74.57 mL/min (ref >60.00)
Glucose, Bld: 90 mg/dL (ref 70–99)
POTASSIUM: 3.8 meq/L (ref 3.5–5.1)
Sodium: 140 mEq/L (ref 135–145)

## 2015-01-07 NOTE — Patient Instructions (Signed)
Medication Instructions:    Labwork:  LIPIDS AND BMET    Testing/Procedures:   Follow-Up:  Your physician wants you to follow-up in:  [place] will receive a reminder letter in the mail two months in advance. If you don't receive a letter, please call our office to schedule the follow-up appointment.    Any Other Special Instructions Will Be Listed Below (If Applicable).

## 2015-01-08 DIAGNOSIS — R55 Syncope and collapse: Secondary | ICD-10-CM | POA: Insufficient documentation

## 2015-01-08 NOTE — Progress Notes (Signed)
Patient ID: Marc Montes, male   DOB: December 26, 1950, 64 y.o.   MRN: [id number] PCP: Dr. Elvina Mattes, [facility]  64 yo with history of CAD s/p LAD PCI in 11/02 returns for cardiology followup.  Left heart cath was done in 8/11 given typical angina, showing a long segment from the LAD ostium throughout the proximal LAD with moderate stenosis (60-70% at most). To treat this lesion would require either a long segment of overlapping drug-eluting stents (increasing risk of stent thrombosis over time) or a CABG with LIMA-LAD.  We elected to manage medically as the patient's symptoms had been only with moderate to heavy exertion.    I last saw him a year ago and he had reported increased chest pain.  I set him up for a Cardiolite but he canceled.  Since then, he has actually been doing well.  He has chest tightness only with heavy exertion like jogging.  He does fine walking up steps or walking on flat ground and up gentle hills.  No exertional dyspnea.  He also had 1 syncopal episode about 6-8 months ago.  He had not eaten that day and was working outside.  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  He has had no recurrence of this and it had not happened prior.  No palpitations.    Labs (7/11): K 5, creatinine 1.1, HCT 41.9, HDL 44, LDL 92, LFTs normal, TSH normal Labs (8/11): K 4.2, creatinine 0.9 Labs (11/11): K 5.1, creatinine 1.12, LDL 92, HDL 44 Labs (2/12): LDL 57, HDL 45, creatinine 0.97  ECG: NSR, inferior T wave inversions  Allergies (verified):  1)  ! Zocor (myalgias)  Past History:  Past Medical History: 1. CAD: Cath in 11/02 after he developed exertional chest pain and had an abnormal myoview.  This showed 90% proximal LAD stenosis.  Patient had BMS to LAD.  EF was 55%.  LHC (8/11): radial approach, EF 60%< 60-70% ostial LAD, proximal LAD stent with 50-60% in-stent restenosis, ostium of D2 is  compressed by the LAD stent with 80% ostial D2 stenosis.   2. Traumatic L1 burst fracture s/p surgery.  Chronic back pain with disability. 3. Peripheral neuropathy 4. Hyperlipidemia: simvastatin caused severe myalgias. Tolerated pravastatin and Crestor.  5. Syncope: ?Vasovagal, 1 episode.  6. Melanoma: On face, s/p surgery 2016.   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  Smoking Status:  Prior.  ROS: All systems reviewed and negative except as per HPI.   Current Outpatient Prescriptions  Medication Sig Dispense Refill  . aspirin 81 MG tablet Take 81 mg by mouth daily.      . Calcium Carbonate-Vitamin D (RA CALCIUM PLUS VITAMIN D) 600-400 MG-UNIT per tablet Take 1 tablet by mouth daily.      . Cholecalciferol (VITAMIN D-3 PO) Take 1 tablet by mouth daily.    . CRESTOR 10 MG tablet TAKE ONE TABLET BY MOUTH ONCE DAILY (MAKE AN APPOINTMENT FOR FURTHER REFILLS) 30 tablet 0  . gabapentin (NEURONTIN) 600 MG tablet Take 600 mg by mouth 3 (three) times daily.      Marland Kitchen GLUCOSAMINE HCL-MSM PO Take 1 tablet by mouth daily.    . isosorbide mononitrate (IMDUR) 60 MG 24 hr tablet TAKE ONE & ONE-HALF TABLETS BY MOUTH ONCE  DAILY NEEDS  APPT  FOR  FURTHER  REFILLS 45 tablet 1  . magnesium oxide (MAG-OX) 400 MG tablet Take 400 mg by mouth daily.    . methocarbamol (ROBAXIN) 500 MG tablet Take 500 mg by mouth as needed.     . metoprolol tartrate (LOPRESSOR) 25 MG tablet Take 0.5 tablets (12.5 mg total) by mouth 2 (two) times daily. 30 tablet 6  . Multiple Vitamin (MULTIVITAMIN) tablet Take 1 tablet by mouth daily.      . nitroGLYCERIN (NITROSTAT) 0.4 MG SL tablet Place 1 tablet(0.4 mg) under the tongue every five minutes for chest pain, After 3 doses if chest pain is not relieved call 911 100 tablet 0  . oxyCODONE-acetaminophen (PERCOCET) 5-325 MG per tablet Take 1 tablet by mouth every  4 (four) hours as needed.      . tamsulosin (FLOMAX) 0.4 MG CAPS capsule Take 0.4 mg by mouth daily.     No current facility-administered medications for this visit.    BP 126/62 mmHg  Pulse 53  Ht 5' 8.45" (1.739 m)  Wt 182 lb (82.555 kg)  BMI 27.30 kg/m2 General: NAD Neck: No JVD, no thyromegaly or thyroid nodule.  Lungs: Clear to auscultation bilaterally with normal respiratory effort. CV: Nondisplaced PMI.  Heart regular S1/S2, no S3/S4, no murmur.  No peripheral edema.  No carotid bruit.  Normal pedal pulses.  Abdomen: Soft, nontender, no hepatosplenomegaly, no distention.   Neurologic: Alert and oriented x 3.  Psych: Normal affect. Extremities: No clubbing or cyanosis.   Assessment/Plan: 1. CAD: Patient had a long moderate proximal LAD stenosis on last cath in 8/11 that was managed medically.  He has a chronic stable angina pattern with heavy exertion that is very stable.   - Continue Imdur, Crestor, metoprolol, and aspirin.   2. Hyperlipidemia: Check lipids today.  3. Syncope: 1 episode about 6 months ago.  No palpitations or chest pain.  ?vasovagal (standing up pumping gas after working outdoors all day without eating).  No recurrent since then.  If this happens again, will need full workup with echo, monitor, etc.   [name] 01/08/2015

## 2015-01-16 ENCOUNTER — Other Ambulatory Visit: Payer: Self-pay | Admitting: Cardiology

## 2015-01-18 ENCOUNTER — Other Ambulatory Visit: Payer: Self-pay

## 2015-01-18 MED ORDER — ISOSORBIDE MONONITRATE ER 60 MG PO TB24: ORAL_TABLET | ORAL | Status: DC

## 2015-05-10 ENCOUNTER — Other Ambulatory Visit: Payer: Self-pay | Admitting: Cardiology

## 2016-01-05 ENCOUNTER — Encounter: Payer: Self-pay | Admitting: *Deleted

## 2016-01-24 ENCOUNTER — Encounter: Payer: Self-pay | Admitting: *Deleted

## 2016-01-24 ENCOUNTER — Encounter: Payer: Self-pay | Admitting: Cardiology

## 2016-01-24 ENCOUNTER — Ambulatory Visit (INDEPENDENT_AMBULATORY_CARE_PROVIDER_SITE_OTHER): Payer: Medicare Other | Admitting: Cardiology

## 2016-01-24 VITALS — BP 134/62 | HR 68 | Ht 68.0 in | Wt 187.0 lb

## 2016-01-24 DIAGNOSIS — I25119 Atherosclerotic heart disease of native coronary artery with unspecified angina pectoris: Secondary | ICD-10-CM | POA: Diagnosis not present

## 2016-01-24 DIAGNOSIS — I208 Other forms of angina pectoris: Secondary | ICD-10-CM

## 2016-01-24 NOTE — Patient Instructions (Signed)
Medication Instructions:  Your physician recommends that you continue on your current medications as directed. Please refer to the Current Medication list given to you today.   Labwork: Lipid profile/BMET/CBCd/PT/INR today  Testing/Procedures: Your physician has requested that you have a cardiac catheterization. Cardiac catheterization is used to diagnose and/or treat various heart conditions. Doctors may recommend this procedure for a number of different reasons. The most common reason is to evaluate chest pain. Chest pain can be a symptom of coronary artery disease (CAD), and cardiac catheterization can show whether plaque is narrowing or blocking your heart's arteries. This procedure is also used to evaluate the valves, as well as measure the blood flow and oxygen levels in different parts of your heart. For further information please visit [facility]uge[facility]iesta[facility][email or web address]. Please follow instruction sheet, as given.  Thursday August 3,2017  Follow-Up: Your physician recommends that you schedule a follow-up appointment in: about 2 weeks after the catheterization  weeks with PA/NP.        If you need a refill on your cardiac medications before your next appointment, please call your pharmacy.

## 2016-01-24 NOTE — Progress Notes (Signed)
Patient ID: Marc Montes, male   DOB: 30-Oct-1950, 65 y.o.   MRN: [id number] PCP: Dr. Elvina Mattes, Marc Montes  65 y.o. with history of CAD s/p LAD PCI in 11/02 returns for cardiology followup.  Left heart cath was done in 8/11 given typical angina, showing a long segment from the LAD ostium throughout the proximal LAD with moderate stenosis (60-70% at most). To treat this lesion would require either a long segment of overlapping drug-eluting stents (increasing risk of stent thrombosis over time) or a CABG with LIMA-LAD.  We elected to manage medically as the patient's symptoms had been only with moderate to heavy exertion.    Recently, he has been having increased chest pain.  65 years now gets it with moderate exertion such as walking up stairs, up a hill, or sweeping out his shop.  It will resolve with rest.  Last week, he had a bad episode of chest pain when walking up a hill.  It did not resolve initially with rest and he had to use NTG.  This is the first time in about 2 years he had had to use NTG.  No chest pain walking on flat ground.  He has pain in his legs with walking that in the past has been attributed to neuropathy.    Labs (7/11): K 5, creatinine 1.1, HCT 41.9, HDL 44, LDL 92, LFTs normal, TSH normal Labs (8/11): K 4.2, creatinine 0.9 Labs (11/11): K 5.1, creatinine 1.12, LDL 92, HDL 44 Labs (2/12): LDL 57, HDL 45, creatinine 0.97 Labs (7/16): K 3.8, creatinine 1.06, LDL 48, HDL 36  ECG: NSR, inferior T wave inversions  Allergies (verified):  1)  ! Zocor (myalgias)  Past History:  Past Medical History: 1. CAD: Cath in 11/02 after he developed exertional chest pain and had an abnormal myoview.  This showed 90% proximal LAD stenosis.  Patient had BMS to LAD.  EF was 55%.  LHC (8/11): radial approach, EF 60%< 60-70% ostial LAD, proximal LAD stent with 50-60% in-stent restenosis, ostium of D2 is compressed by the LAD stent with 80% ostial D2 stenosis.   2. Traumatic L1 burst fracture s/p  surgery.  Chronic back pain with disability. 3. Peripheral neuropathy 4. Hyperlipidemia: simvastatin caused severe myalgias. Tolerated pravastatin and Crestor.  5. Syncope: ?Vasovagal, 1 episode.  6. Melanoma: On face, s/p surgery 2016.   Family History: No CAD  Social History: Retired Dealer, disability from back injury.  Married-Lives outside of [place] Alcohol Use - no Tobacco Use - No.  Smoking Status:  Prior.  ROS: All systems reviewed and negative except as per HPI.   Current Outpatient Prescriptions  Medication Sig Dispense Refill  . aspirin 81 MG tablet Take 81 mg by mouth daily.      . Calcium Carbonate-Vitamin D (RA CALCIUM PLUS VITAMIN D) 600-400 MG-UNIT per tablet Take 1 tablet by mouth daily.      . Cholecalciferol (VITAMIN D-3 PO) Take 1 tablet by mouth daily.    . CRESTOR 10 MG tablet TAKE ONE TABLET BY MOUTH ONCE DAILY (MAKE AN APPOINTMENT FOR FURTHER REFILLS) 30 tablet 0  . gabapentin (NEURONTIN) 600 MG tablet Take 600 mg by mouth 3 (three) times daily.      Marland Kitchen GLUCOSAMINE HCL-MSM PO Take 1 tablet by mouth daily.    . isosorbide mononitrate (IMDUR) 60 MG 24 hr tablet TAKE ONE & ONE-HALF TABLETS BY MOUTH ONCE DAILY (NEEDS  APPT) 45 tablet 8  . magnesium oxide (MAG-OX) 400 MG tablet Take 400  mg by mouth daily.    . methocarbamol (ROBAXIN) 500 MG tablet Take 500 mg by mouth as needed.     . metoprolol tartrate (LOPRESSOR) 25 MG tablet Take 0.5 tablets (12.5 mg total) by mouth 2 (two) times daily. 30 tablet 6  . Multiple Vitamin (MULTIVITAMIN) tablet Take 1 tablet by mouth daily.      . nitroGLYCERIN (NITROSTAT) 0.4 MG SL tablet Place 1 tablet(0.4 mg) under the tongue every five minutes for chest pain, After 3 doses if chest pain is not relieved call 911 100 tablet 0  . oxyCODONE-acetaminophen (PERCOCET) 5-325 MG per tablet Take 1 tablet by mouth every 4 (four) hours as needed.      . tamsulosin (FLOMAX) 0.4 MG CAPS capsule Take 0.4 mg by mouth daily.     No  current facility-administered medications for this visit.     BP 134/62   Pulse 68   Ht 5\' 8"  (1.727 m)   Wt 187 lb (84.8 kg)   BMI 28.43 kg/m  General: NAD Neck: No JVD, no thyromegaly or thyroid nodule.  Lungs: Clear to auscultation bilaterally with normal respiratory effort. CV: Nondisplaced PMI.  Heart regular S1/S2, no S3/S4, no murmur.  No peripheral edema.  No carotid bruit.  Normal pedal pulses.  Abdomen: Soft, nontender, no hepatosplenomegaly, no distention.   Neurologic: Alert and oriented x 3.  Psych: Normal affect. Extremities: No clubbing or cyanosis.   Assessment/Plan: 1. CAD: Patient had a long moderate proximal LAD stenosis on last cath in 8/11 that was managed medically.  He has had recent worsening of his chronic anginal pattern.  He is already on high dose Imdur and as much metoprolol as he has been able to tolerate.   - Continue Imdur, Crestor, metoprolol, and aspirin.   - We discussed coronary angiography today given worsening symptoms.  We discussed risks/benefits and he agrees to proceed.  I will schedule him for next week.  2. Hyperlipidemia: Check lipids today.  3. Leg pain: Suspect this is neuropathic given his back history.  He has good pedal pulses so doubt PAD.    [name] 01/24/2016

## 2016-01-25 LAB — CBC WITH DIFFERENTIAL/PLATELET
BASOS ABS: 0 {cells}/uL (ref 0–200)
BASOS PCT: 0 %
EOS ABS: 146 {cells}/uL (ref 15–500)
Eosinophils Relative: 2 %
HEMATOCRIT: 41.3 % (ref 38.5–50.0)
Hemoglobin: 14.1 g/dL (ref 13.2–17.1)
LYMPHS PCT: 38 %
Lymphs Abs: 2774 cells/uL (ref 850–3900)
MCH: 29.7 pg (ref 27.0–33.0)
MCHC: 34.1 g/dL (ref 32.0–36.0)
MCV: 87.1 fL (ref 80.0–100.0)
MONO ABS: 365 {cells}/uL (ref 200–950)
MONOS PCT: 5 %
MPV: 9.9 fL (ref 7.5–12.5)
NEUTROS PCT: 55 %
Neutro Abs: 4015 cells/uL (ref 1500–7800)
Platelets: 208 10*3/uL (ref 140–400)
RBC: 4.74 MIL/uL (ref 4.20–5.80)
RDW: 13.3 % (ref 11.0–15.0)
WBC: 7.3 10*3/uL (ref 3.8–10.8)

## 2016-01-25 LAB — BASIC METABOLIC PANEL
BUN: 11 mg/dL (ref 7–25)
CO2: 26 mmol/L (ref 20–31)
CREATININE: 1.15 mg/dL (ref 0.70–1.25)
Calcium: 8.9 mg/dL (ref 8.6–10.3)
Chloride: 106 mmol/L (ref 98–110)
GLUCOSE: 84 mg/dL (ref 65–99)
Potassium: 4.5 mmol/L (ref 3.5–5.3)
SODIUM: 140 mmol/L (ref 135–146)

## 2016-01-25 LAB — LIPID PANEL
CHOL/HDL RATIO: 3.1 ratio (ref <=5.0)
Cholesterol: 130 mg/dL (ref 125–200)
HDL: 42 mg/dL (ref >=40)
LDL CALC: 56 mg/dL (ref <130)
Triglycerides: 162 mg/dL — ABNORMAL HIGH (ref <150)
VLDL: 32 mg/dL — AB (ref <30)

## 2016-01-25 LAB — PROTIME-INR
INR: 1
Prothrombin Time: 10.4 s (ref 9.0–11.5)

## 2016-02-03 ENCOUNTER — Ambulatory Visit (HOSPITAL_COMMUNITY)
Admission: RE | Admit: 2016-02-03 | Discharge: 2016-02-04 | Disposition: A | Discharge status: Home / Self Care | Payer: Medicare Other | Source: Ambulatory Visit | Attending: Cardiovascular Disease | Admitting: Cardiovascular Disease

## 2016-02-03 ENCOUNTER — Encounter (HOSPITAL_COMMUNITY)
Admission: RE | Disposition: A | Discharge status: Home / Self Care | Payer: Self-pay | Source: Ambulatory Visit | Attending: Cardiovascular Disease

## 2016-02-03 ENCOUNTER — Encounter (HOSPITAL_COMMUNITY): Payer: Self-pay

## 2016-02-03 DIAGNOSIS — Z955 Presence of coronary angioplasty implant and graft: Secondary | ICD-10-CM

## 2016-02-03 DIAGNOSIS — I25110 Atherosclerotic heart disease of native coronary artery with unstable angina pectoris: Secondary | ICD-10-CM

## 2016-02-03 DIAGNOSIS — M549 Dorsalgia, unspecified: Secondary | ICD-10-CM | POA: Insufficient documentation

## 2016-02-03 DIAGNOSIS — R55 Syncope and collapse: Secondary | ICD-10-CM | POA: Diagnosis present

## 2016-02-03 DIAGNOSIS — G629 Polyneuropathy, unspecified: Secondary | ICD-10-CM | POA: Insufficient documentation

## 2016-02-03 DIAGNOSIS — Z7982 Long term (current) use of aspirin: Secondary | ICD-10-CM | POA: Insufficient documentation

## 2016-02-03 DIAGNOSIS — Z531 Procedure and treatment not carried out because of patient's decision for reasons of belief and group pressure: Secondary | ICD-10-CM | POA: Diagnosis not present

## 2016-02-03 DIAGNOSIS — Z85820 Personal history of malignant melanoma of skin: Secondary | ICD-10-CM | POA: Insufficient documentation

## 2016-02-03 DIAGNOSIS — E7800 Pure hypercholesterolemia, unspecified: Secondary | ICD-10-CM | POA: Diagnosis present

## 2016-02-03 DIAGNOSIS — R5381 Other malaise: Secondary | ICD-10-CM | POA: Diagnosis not present

## 2016-02-03 DIAGNOSIS — G8929 Other chronic pain: Secondary | ICD-10-CM | POA: Insufficient documentation

## 2016-02-03 DIAGNOSIS — T82858A Stenosis of vascular prosthetic devices, implants and grafts, initial encounter: Secondary | ICD-10-CM | POA: Diagnosis not present

## 2016-02-03 DIAGNOSIS — I209 Angina pectoris, unspecified: Secondary | ICD-10-CM | POA: Diagnosis present

## 2016-02-03 DIAGNOSIS — Y838 Other surgical procedures as the cause of abnormal reaction of the patient, or of later complication, without mention of misadventure at the time of the procedure: Secondary | ICD-10-CM | POA: Insufficient documentation

## 2016-02-03 DIAGNOSIS — I2510 Atherosclerotic heart disease of native coronary artery without angina pectoris: Secondary | ICD-10-CM | POA: Diagnosis present

## 2016-02-03 DIAGNOSIS — I2089 Other forms of angina pectoris: Secondary | ICD-10-CM | POA: Diagnosis present

## 2016-02-03 DIAGNOSIS — I25118 Atherosclerotic heart disease of native coronary artery with other forms of angina pectoris: Secondary | ICD-10-CM | POA: Diagnosis present

## 2016-02-03 DIAGNOSIS — E785 Hyperlipidemia, unspecified: Secondary | ICD-10-CM | POA: Diagnosis not present

## 2016-02-03 DIAGNOSIS — I208 Other forms of angina pectoris: Secondary | ICD-10-CM | POA: Diagnosis present

## 2016-02-03 HISTORY — DX: Low back pain, unspecified: M54.50

## 2016-02-03 HISTORY — DX: Personal history of other diseases of the digestive system: Z87.19

## 2016-02-03 HISTORY — DX: Malignant melanoma of lip: C43.0

## 2016-02-03 HISTORY — PX: CARDIAC CATHETERIZATION: SHX172

## 2016-02-03 HISTORY — DX: Gastro-esophageal reflux disease without esophagitis: K21.9

## 2016-02-03 HISTORY — DX: Other chronic pain: G89.29

## 2016-02-03 HISTORY — DX: Reserved for inherently not codable concepts without codable children: IMO0001

## 2016-02-03 HISTORY — DX: Low back pain: M54.5

## 2016-02-03 HISTORY — DX: Basal cell carcinoma of skin of unspecified parts of face: C44.310

## 2016-02-03 HISTORY — DX: Contact with and (suspected) exposure to tuberculosis: Z20.1

## 2016-02-03 HISTORY — DX: Procedure and treatment not carried out because of patient's decision for reasons of belief and group pressure: Z53.1

## 2016-02-03 LAB — POCT ACTIVATED CLOTTING TIME
ACTIVATED CLOTTING TIME: 230 s
ACTIVATED CLOTTING TIME: 296 s
Activated Clotting Time: 285 seconds
Activated Clotting Time: 291 seconds

## 2016-02-03 LAB — NO BLOOD PRODUCTS

## 2016-02-03 SURGERY — LEFT HEART CATH AND CORONARY ANGIOGRAPHY

## 2016-02-03 MED ORDER — ASPIRIN EC 81 MG PO TBEC
81.0000 mg | DELAYED_RELEASE_TABLET | Freq: Every day | ORAL | Status: DC
  Filled 2016-02-03 (×2): qty 1

## 2016-02-03 MED ORDER — ASPIRIN 81 MG PO CHEW
CHEWABLE_TABLET | ORAL | Status: AC
  Filled 2016-02-03: qty 1

## 2016-02-03 MED ORDER — LIDOCAINE HCL (PF) 1 % IJ SOLN
INTRAMUSCULAR | Status: DC | PRN
Start: 2016-02-03 — End: 2016-02-03
  Administered 2016-02-03: 2 mL

## 2016-02-03 MED ORDER — FENTANYL CITRATE (PF) 100 MCG/2ML IJ SOLN
INTRAMUSCULAR | Status: DC | PRN
  Administered 2016-02-03: 25 ug via INTRAVENOUS

## 2016-02-03 MED ORDER — TICAGRELOR 90 MG PO TABS
ORAL_TABLET | ORAL | Status: AC
  Filled 2016-02-03: qty 2

## 2016-02-03 MED ORDER — LIDOCAINE HCL (PF) 1 % IJ SOLN
INTRAMUSCULAR | Status: AC
  Filled 2016-02-03: qty 30

## 2016-02-03 MED ORDER — ROSUVASTATIN CALCIUM 10 MG PO TABS
10.0000 mg | ORAL_TABLET | Freq: Every day | ORAL | Status: DC
  Administered 2016-02-03: 17:00:00 10 mg via ORAL
  Filled 2016-02-03: qty 1

## 2016-02-03 MED ORDER — VERAPAMIL HCL 2.5 MG/ML IV SOLN
INTRAVENOUS | Status: AC
  Filled 2016-02-03: qty 2

## 2016-02-03 MED ORDER — MIDAZOLAM HCL 2 MG/2ML IJ SOLN
INTRAMUSCULAR | Status: DC | PRN
  Administered 2016-02-03: 1 mg via INTRAVENOUS

## 2016-02-03 MED ORDER — SODIUM CHLORIDE 0.9 % IV SOLN: 250.0000 mL | INTRAVENOUS | Status: DC | PRN

## 2016-02-03 MED ORDER — MIDAZOLAM HCL 2 MG/2ML IJ SOLN
INTRAMUSCULAR | Status: AC
  Filled 2016-02-03: qty 2

## 2016-02-03 MED ORDER — HEPARIN (PORCINE) IN NACL 2-0.9 UNIT/ML-% IJ SOLN
INTRAMUSCULAR | Status: DC | PRN
  Administered 2016-02-03: 1500 mL

## 2016-02-03 MED ORDER — SODIUM CHLORIDE 0.9% FLUSH: 3.0000 mL | INTRAVENOUS | Status: DC | PRN

## 2016-02-03 MED ORDER — FENTANYL CITRATE (PF) 100 MCG/2ML IJ SOLN
INTRAMUSCULAR | Status: AC
Start: 2016-02-03 — End: 2016-02-03
  Filled 2016-02-03: qty 2

## 2016-02-03 MED ORDER — TICAGRELOR 90 MG PO TABS
ORAL_TABLET | ORAL | Status: DC | PRN
  Administered 2016-02-03: 180 mg via ORAL

## 2016-02-03 MED ORDER — HEPARIN SODIUM (PORCINE) 1000 UNIT/ML IJ SOLN
INTRAMUSCULAR | Status: DC | PRN
  Administered 2016-02-03: 4000 [IU] via INTRAVENOUS
  Administered 2016-02-03: 3000 [IU] via INTRAVENOUS
  Administered 2016-02-03: 4000 [IU] via INTRAVENOUS
  Administered 2016-02-03: 2000 [IU] via INTRAVENOUS
  Administered 2016-02-03: 6000 [IU] via INTRAVENOUS

## 2016-02-03 MED ORDER — ONDANSETRON HCL 4 MG/2ML IJ SOLN: 4.0000 mg | Freq: Four times a day (QID) | INTRAMUSCULAR | Status: DC | PRN

## 2016-02-03 MED ORDER — ACETAMINOPHEN 325 MG PO TABS: 650.0000 mg | ORAL_TABLET | ORAL | Status: DC | PRN

## 2016-02-03 MED ORDER — NITROGLYCERIN 1 MG/10 ML FOR IR/CATH LAB
INTRA_ARTERIAL | Status: AC
  Filled 2016-02-03: qty 10

## 2016-02-03 MED ORDER — SODIUM CHLORIDE 0.9 % WEIGHT BASED INFUSION: 1.0000 mL/kg/h | INTRAVENOUS | Status: DC

## 2016-02-03 MED ORDER — SODIUM CHLORIDE 0.9 % WEIGHT BASED INFUSION
3.0000 mL/kg/h | INTRAVENOUS | Status: DC
  Administered 2016-02-03: 3 mL/kg/h via INTRAVENOUS

## 2016-02-03 MED ORDER — IOPAMIDOL (ISOVUE-370) INJECTION 76%
INTRAVENOUS | Status: AC
  Filled 2016-02-03: qty 50

## 2016-02-03 MED ORDER — TICAGRELOR 90 MG PO TABS
90.0000 mg | ORAL_TABLET | Freq: Two times a day (BID) | ORAL | Status: DC
Start: 2016-02-03 — End: 2016-02-04
  Administered 2016-02-03 – 2016-02-04 (×2): 90 mg via ORAL
  Filled 2016-02-03 (×2): qty 1

## 2016-02-03 MED ORDER — HEPARIN SODIUM (PORCINE) 1000 UNIT/ML IJ SOLN
INTRAMUSCULAR | Status: AC
  Filled 2016-02-03: qty 1

## 2016-02-03 MED ORDER — SODIUM CHLORIDE 0.9 % WEIGHT BASED INFUSION: 1.0000 mL/kg/h | INTRAVENOUS | Status: AC

## 2016-02-03 MED ORDER — ISOSORBIDE MONONITRATE ER 60 MG PO TB24
90.0000 mg | ORAL_TABLET | Freq: Every day | ORAL | Status: DC
  Administered 2016-02-04: 11:00:00 90 mg via ORAL
  Filled 2016-02-03: qty 1

## 2016-02-03 MED ORDER — OXYCODONE-ACETAMINOPHEN 5-325 MG PO TABS: 1.0000 | ORAL_TABLET | Freq: Three times a day (TID) | ORAL | Status: DC | PRN

## 2016-02-03 MED ORDER — ANGIOPLASTY BOOK
Freq: Once | Status: AC
  Administered 2016-02-03: 21:00:00
  Filled 2016-02-03: qty 1

## 2016-02-03 MED ORDER — ASPIRIN 81 MG PO CHEW
81.0000 mg | CHEWABLE_TABLET | ORAL | Status: AC
  Administered 2016-02-03: 81 mg via ORAL

## 2016-02-03 MED ORDER — SODIUM CHLORIDE 0.9% FLUSH: 3.0000 mL | Freq: Two times a day (BID) | INTRAVENOUS | Status: DC

## 2016-02-03 MED ORDER — IOPAMIDOL (ISOVUE-370) INJECTION 76%
INTRAVENOUS | Status: DC | PRN
  Administered 2016-02-03: 75 mL via INTRA_ARTERIAL

## 2016-02-03 MED ORDER — HEPARIN (PORCINE) IN NACL 2-0.9 UNIT/ML-% IJ SOLN
INTRAMUSCULAR | Status: AC
  Filled 2016-02-03: qty 1500

## 2016-02-03 MED ORDER — IOPAMIDOL (ISOVUE-370) INJECTION 76%
INTRAVENOUS | Status: AC
  Filled 2016-02-03: qty 100

## 2016-02-03 MED ORDER — GABAPENTIN 600 MG PO TABS
600.0000 mg | ORAL_TABLET | Freq: Three times a day (TID) | ORAL | Status: DC
  Administered 2016-02-03 – 2016-02-04 (×3): 600 mg via ORAL
  Filled 2016-02-03 (×3): qty 1

## 2016-02-03 MED ORDER — METHOCARBAMOL 500 MG PO TABS: 500.0000 mg | ORAL_TABLET | Freq: Three times a day (TID) | ORAL | Status: DC | PRN

## 2016-02-03 MED ORDER — TAMSULOSIN HCL 0.4 MG PO CAPS
0.4000 mg | ORAL_CAPSULE | Freq: Every day | ORAL | Status: DC
  Administered 2016-02-04: 0.4 mg via ORAL
  Filled 2016-02-03: qty 1

## 2016-02-03 MED ORDER — NITROGLYCERIN 1 MG/10 ML FOR IR/CATH LAB
INTRA_ARTERIAL | Status: DC | PRN
  Administered 2016-02-03: 200 ug via INTRACORONARY

## 2016-02-03 MED ORDER — METOPROLOL TARTRATE 12.5 MG HALF TABLET
12.5000 mg | ORAL_TABLET | Freq: Two times a day (BID) | ORAL | Status: DC
  Administered 2016-02-03 – 2016-02-04 (×2): 12.5 mg via ORAL
  Filled 2016-02-03 (×3): qty 1

## 2016-02-03 MED ORDER — ADENOSINE 12 MG/4ML IV SOLN
INTRAVENOUS | Status: AC
  Filled 2016-02-03: qty 16

## 2016-02-03 MED ORDER — SODIUM CHLORIDE 0.9% FLUSH
3.0000 mL | Freq: Two times a day (BID) | INTRAVENOUS | Status: DC
  Administered 2016-02-03: 3 mL via INTRAVENOUS

## 2016-02-03 MED ORDER — HEPARIN (PORCINE) IN NACL 2-0.9 UNIT/ML-% IJ SOLN
INTRAMUSCULAR | Status: DC | PRN
  Administered 2016-02-03: 09:00:00 via INTRA_ARTERIAL

## 2016-02-03 MED ORDER — IOPAMIDOL (ISOVUE-370) INJECTION 76%
INTRAVENOUS | Status: DC | PRN
  Administered 2016-02-03: 145 mL via INTRA_ARTERIAL

## 2016-02-03 SURGICAL SUPPLY — 24 items
BALLN EMERGE MR 2.25X15 (BALLOONS) ×2
BALLN ~~LOC~~ EMERGE MR 2.75X20 (BALLOONS) ×2
BALLOON EMERGE MR 2.25X15 (BALLOONS) ×1 IMPLANT
BALLOON ~~LOC~~ EMERGE MR 2.75X20 (BALLOONS) ×1 IMPLANT
CATH INFINITI 5 FR JL3.5 (CATHETERS) ×2 IMPLANT
CATH INFINITI 5FR ANG PIGTAIL (CATHETERS) ×2 IMPLANT
CATH INFINITI JR4 5F (CATHETERS) ×2 IMPLANT
CATH MICROCATH NAVVUS (MICROCATHETER) ×1 IMPLANT
CATH OPTICROSS 40MHZ (CATHETERS) ×2 IMPLANT
CATH VISTA GUIDE 6FR XBLAD3.5 (CATHETERS) ×2 IMPLANT
DEVICE RAD COMP TR BAND LRG (VASCULAR PRODUCTS) ×2 IMPLANT
GLIDESHEATH SLEND SS 6F .021 (SHEATH) ×2 IMPLANT
KIT ENCORE 26 ADVANTAGE (KITS) ×2 IMPLANT
KIT ESSENTIALS PG (KITS) ×2 IMPLANT
KIT HEART LEFT (KITS) ×2 IMPLANT
MICROCATHETER NAVVUS (MICROCATHETER) ×2
PACK CARDIAC CATHETERIZATION (CUSTOM PROCEDURE TRAY) ×2 IMPLANT
SLED PULL BACK IVUS (MISCELLANEOUS) ×2 IMPLANT
STENT PROMUS PREM MR 2.5X38 (Permanent Stent) ×2 IMPLANT
SYR MEDRAD MARK V 150ML (SYRINGE) ×2 IMPLANT
TRANSDUCER W/STOPCOCK (MISCELLANEOUS) ×2 IMPLANT
TUBING CIL FLEX 10 FLL-RA (TUBING) ×2 IMPLANT
WIRE ASAHI PROWATER 180CM (WIRE) ×2 IMPLANT
WIRE SAFE-T 1.5MM-J .035X260CM (WIRE) ×2 IMPLANT

## 2016-02-03 NOTE — Interval H&P Note (Signed)
Cath Lab Visit (complete for each Cath Lab visit)  Clinical Evaluation Leading to the Procedure:   ACS: Yes.    Non-ACS:    Anginal Classification: CCS Montes  Anti-ischemic medical therapy: Maximal Therapy (2 or more classes of medications)  Non-Invasive Test Results: No non-invasive testing performed  Prior CABG: No previous CABG      History and Physical Interval Note:  02/03/2016 10:01 AM  Marc Montes  has presented today for surgery, with the diagnosis of angina - cad  The various methods of treatment have been discussed with the patient and family. After consideration of risks, benefits and other options for treatment, the patient has consented to  Procedure(s): Left Heart Cath and Coronary Angiography (N/A) Intravascular Pressure Wire/FFR Study (N/A) as a surgical intervention .  The patient's history has been reviewed, patient examined, no change in status, stable for surgery.  I have reviewed the patient's chart and labs.  Questions were answered to the patient's satisfaction.     Marc Montes

## 2016-02-03 NOTE — H&P (View-Only) (Signed)
Indication: 1. Suspected CAD  2. No Prior Noninvasive Testing 3. Symptomatic 4. High Pretest Probability A (7) Indication: 10;  Marc Montes 02/03/2016 9:41 AM

## 2016-02-03 NOTE — Progress Notes (Signed)
Indication: 1. Suspected CAD  2. No Prior Noninvasive Testing 3. Symptomatic 4. High Pretest Probability A (7) Indication: 10;  Marc Montes 02/03/2016 9:41 AM

## 2016-02-03 NOTE — Interval H&P Note (Signed)
Cath Lab Visit (complete for each Cath Lab visit)  Clinical Evaluation Leading to the Procedure:   ACS: No.  Non-ACS:    Anginal Classification: CCS III  Anti-ischemic medical therapy: Maximal Therapy (2 or more classes of medications)  Non-Invasive Test Results: No non-invasive testing performed  Prior CABG: No previous CABG      History and Physical Interval Note:  02/03/2016 9:05 AM  Marc Montes  has presented today for surgery, with the diagnosis of angina - cad  The various methods of treatment have been discussed with the patient and family. After consideration of risks, benefits and other options for treatment, the patient has consented to  Procedure(s): Left Heart Cath and Coronary Angiography (N/A) as a surgical intervention .  The patient's history has been reviewed, patient examined, no change in status, stable for surgery.  I have reviewed the patient's chart and labs.  Questions were answered to the patient's satisfaction.     Annina Piotrowski [employer]

## 2016-02-03 NOTE — Care Management Note (Addendum)
Case Management Note  Patient Details  Name: Marc Montes MRN: [id number] Date of Birth: [birth year]  Subjective/Objective:   Patient s/p coronary stent intervention, will be on Brilinta and asa per MD note,  NCM will cont to follow for dc needs.   Awaiting benefit check for Brilinta.               Action/Plan:   Expected Discharge Date:                  Expected Discharge Plan:  Home/Self Care  In-House Referral:     Discharge planning Services  CM Consult  Post Acute Care Choice:    Choice offered to:     DME Arranged:    DME Agency:     HH Arranged:    HH Agency:     Status of Service:  In process, will continue to follow  If discussed at Long Length of Stay Meetings, dates discussed:    Additional Comments:  Zenon Mayo, RN 02/03/2016, 2:37 PM

## 2016-02-03 NOTE — H&P (View-Only) (Signed)
Patient ID: Marc Montes, male   DOB: Oct 21, 1950, 65 y.o.   MRN: [id number] PCP: Dr. Elvina Mattes, Elder Cyphers  65 yo with history of CAD s/p LAD PCI in 11/02 returns for cardiology followup.  Left heart cath was done in 8/11 given typical angina, showing a long segment from the LAD ostium throughout the proximal LAD with moderate stenosis (60-70% at most). To treat this lesion would require either a long segment of overlapping drug-eluting stents (increasing risk of stent thrombosis over time) or a CABG with LIMA-LAD.  We elected to manage medically as the patient's symptoms had been only with moderate to heavy exertion.    Recently, he has been having increased chest pain.  He now gets it with moderate exertion such as walking up stairs, up a hill, or sweeping out his shop.  It will resolve with rest.  Last week, he had a bad episode of chest pain when walking up a hill.  It did not resolve initially with rest and he had to use NTG.  This is the first time in about 2 years he had had to use NTG.  No chest pain walking on flat ground.  He has pain in his legs with walking that in the past has been attributed to neuropathy.    Labs (7/11): K 5, creatinine 1.1, HCT 41.9, HDL 44, LDL 92, LFTs normal, TSH normal Labs (8/11): K 4.2, creatinine 0.9 Labs (11/11): K 5.1, creatinine 1.12, LDL 92, HDL 44 Labs (2/12): LDL 57, HDL 45, creatinine 0.97 Labs (7/16): K 3.8, creatinine 1.06, LDL 48, HDL 36  ECG: NSR, inferior T wave inversions  Allergies (verified):  1)  ! Zocor (myalgias)  Past History:  Past Medical History: 1. CAD: Cath in 11/02 after he developed exertional chest pain and had an abnormal myoview.  This showed 90% proximal LAD stenosis.  Patient had BMS to LAD.  EF was 55%.  LHC (8/11): radial approach, EF 60%< 60-70% ostial LAD, proximal LAD stent with 50-60% in-stent restenosis, ostium of D2 is compressed by the LAD stent with 80% ostial D2 stenosis.   2. Traumatic L1 burst fracture s/p  surgery.  Chronic back pain with disability. 3. Peripheral neuropathy 4. Hyperlipidemia: simvastatin caused severe myalgias. Tolerated pravastatin and Crestor.  5. Syncope: ?Vasovagal, 1 episode.  6. Melanoma: On face, s/p surgery 2016.   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  Smoking Status:  Prior.  ROS: All systems reviewed and negative except as per HPI.   Current Outpatient Prescriptions  Medication Sig Dispense Refill  . aspirin 81 MG tablet Take 81 mg by mouth daily.      . Calcium Carbonate-Vitamin D (RA CALCIUM PLUS VITAMIN D) 600-400 MG-UNIT per tablet Take 1 tablet by mouth daily.      . Cholecalciferol (VITAMIN D-3 PO) Take 1 tablet by mouth daily.    . CRESTOR 10 MG tablet TAKE ONE TABLET BY MOUTH ONCE DAILY (MAKE AN APPOINTMENT FOR FURTHER REFILLS) 30 tablet 0  . gabapentin (NEURONTIN) 600 MG tablet Take 600 mg by mouth 3 (three) times daily.      Marland Kitchen GLUCOSAMINE HCL-MSM PO Take 1 tablet by mouth daily.    . isosorbide mononitrate (IMDUR) 60 MG 24 hr tablet TAKE ONE & ONE-HALF TABLETS BY MOUTH ONCE DAILY (NEEDS  APPT) 45 tablet 8  . magnesium oxide (MAG-OX) 400 MG tablet Take 400  mg by mouth daily.    . methocarbamol (ROBAXIN) 500 MG tablet Take 500 mg by mouth as needed.     . metoprolol tartrate (LOPRESSOR) 25 MG tablet Take 0.5 tablets (12.5 mg total) by mouth 2 (two) times daily. 30 tablet 6  . Multiple Vitamin (MULTIVITAMIN) tablet Take 1 tablet by mouth daily.      . nitroGLYCERIN (NITROSTAT) 0.4 MG SL tablet Place 1 tablet(0.4 mg) under the tongue every five minutes for chest pain, After 3 doses if chest pain is not relieved call 911 100 tablet 0  . oxyCODONE-acetaminophen (PERCOCET) 5-325 MG per tablet Take 1 tablet by mouth every 4 (four) hours as needed.      . tamsulosin (FLOMAX) 0.4 MG CAPS capsule Take 0.4 mg by mouth daily.     No  current facility-administered medications for this visit.     BP 134/62   Pulse 68   Ht 5\' 8"  (1.727 m)   Wt 187 lb (84.8 kg)   BMI 28.43 kg/m  General: NAD Neck: No JVD, no thyromegaly or thyroid nodule.  Lungs: Clear to auscultation bilaterally with normal respiratory effort. CV: Nondisplaced PMI.  Heart regular S1/S2, no S3/S4, no murmur.  No peripheral edema.  No carotid bruit.  Normal pedal pulses.  Abdomen: Soft, nontender, no hepatosplenomegaly, no distention.   Neurologic: Alert and oriented x 3.  Psych: Normal affect. Extremities: No clubbing or cyanosis.   Assessment/Plan: 1. CAD: Patient had a long moderate proximal LAD stenosis on last cath in 8/11 that was managed medically.  He has had recent worsening of his chronic anginal pattern.  He is already on high dose Imdur and as much metoprolol as he has been able to tolerate.   - Continue Imdur, Crestor, metoprolol, and aspirin.   - We discussed coronary angiography today given worsening symptoms.  We discussed risks/benefits and he agrees to proceed.  I will schedule him for next week.  2. Hyperlipidemia: Check lipids today.  3. Leg pain: Suspect this is neuropathic given his back history.  He has good pedal pulses so doubt PAD.    [name] 01/24/2016

## 2016-02-03 NOTE — Progress Notes (Signed)
TR BAND REMOVAL  LOCATION:    right radial  DEFLATED PER PROTOCOL:    Yes.    TIME BAND OFF / DRESSING APPLIED:    1900   SITE UPON ARRIVAL:    Level 0  SITE AFTER BAND REMOVAL:    Level 1  CIRCULATION SENSATION AND MOVEMENT:    Within Normal Limits   Yes.    COMMENTS:   Site rebled after last air draw, Tolerated procedure well , post TRB instructions given

## 2016-02-04 ENCOUNTER — Encounter (HOSPITAL_COMMUNITY): Payer: Self-pay | Admitting: Cardiology

## 2016-02-04 DIAGNOSIS — I208 Other forms of angina pectoris: Secondary | ICD-10-CM | POA: Diagnosis not present

## 2016-02-04 DIAGNOSIS — I25118 Atherosclerotic heart disease of native coronary artery with other forms of angina pectoris: Secondary | ICD-10-CM | POA: Diagnosis not present

## 2016-02-04 DIAGNOSIS — G8929 Other chronic pain: Secondary | ICD-10-CM | POA: Diagnosis not present

## 2016-02-04 DIAGNOSIS — M549 Dorsalgia, unspecified: Secondary | ICD-10-CM | POA: Diagnosis not present

## 2016-02-04 DIAGNOSIS — R5381 Other malaise: Secondary | ICD-10-CM | POA: Diagnosis not present

## 2016-02-04 LAB — BASIC METABOLIC PANEL
ANION GAP: 7 (ref 5–15)
BUN: 8 mg/dL (ref 6–20)
CALCIUM: 9.1 mg/dL (ref 8.9–10.3)
CO2: 26 mmol/L (ref 22–32)
CREATININE: 1.03 mg/dL (ref 0.61–1.24)
Chloride: 106 mmol/L (ref 101–111)
Glucose, Bld: 96 mg/dL (ref 65–99)
Potassium: 4.1 mmol/L (ref 3.5–5.1)
Sodium: 139 mmol/L (ref 135–145)

## 2016-02-04 MED ORDER — TICAGRELOR 90 MG PO TABS: 90.0000 mg | ORAL_TABLET | Freq: Two times a day (BID) | ORAL | 3 refills | Status: DC

## 2016-02-04 MED ORDER — TICAGRELOR 90 MG PO TABS: 90.0000 mg | ORAL_TABLET | Freq: Two times a day (BID) | ORAL | 0 refills | Status: DC

## 2016-02-04 MED FILL — BRILINTA 90 MG TABLET: 90 | 30 days supply | Qty: 60 | Fill #0

## 2016-02-04 MED FILL — Adenosine IV Soln 12 MG/4ML: INTRAVENOUS | Qty: 16 | Status: AC

## 2016-02-04 NOTE — Care Management Note (Signed)
Case Management Note  Patient Details  Name: AMBUS BOCOCK MRN: [id number] Date of Birth: [birth year]  Subjective/Objective:   Patient s/p coronary stent intervention on Brilinta, per benefit check.   S/W MARK @ [employer] RX # [phone]   BRILINTA 90 MG BID ( 30 )  COVER- YES  CO-PAY- $ 3.70  TIER- 3 DRUG  PRIOR APPROVAL - NO  PHARMACY : [id number]                  Action/Plan:   Expected Discharge Date:                  Expected Discharge Plan:  Home/Self Care  In-House Referral:     Discharge planning Services  CM Consult  Post Acute Care Choice:    Choice offered to:     DME Arranged:    DME Agency:     HH Arranged:    [place] Agency:     Status of Service:  Completed, signed off  If discussed at [employer] of Stay Meetings, dates discussed:    Additional Comments:  Zenon Mayo, RN 02/04/2016, 9:07 AM

## 2016-02-04 NOTE — Progress Notes (Signed)
CARDIAC REHAB PHASE I   PRE:  Rate/Rhythm: 76 SR  BP:  Supine: 141/84  Sitting:   Standing:    SaO2: 97%RA  MODE:  Ambulation: 800 ft   POST:  Rate/Rhythm: 80 SR  BP:  Supine:   Sitting: 201/97, 172/90  Standing:    SaO2:  0810-0900 Pt walked 800 ft with steady gait. No CP. Tolerated well. Education completed with pt who voiced understanding. Stressed importance of brilinta with stent. Case manager to see. Discussed NTG use, heart healthy diet and ex ed. Pt drinks about 6 regular Dr Samson Frederic a day and eats very little. Stated has been doing this for over 40 years. Not sure he wants to change. Encouraged him to read heart healthy diet and try to add fruits and vegetables. Discussed CRP 2 and referring to [facility].   Graylon Good, RN BSN  02/04/2016 8:55 AM

## 2016-02-04 NOTE — Progress Notes (Signed)
Patient ID: Marc Montes, male   DOB: 1951/04/02, 65 y.o.   MRN: [id number]    SUBJECTIVE: No complaints, no chest pain.   Scheduled Meds: . aspirin EC  81 mg Oral Daily  . gabapentin  600 mg Oral TID  . isosorbide mononitrate  90 mg Oral Daily  . metoprolol tartrate  12.5 mg Oral BID  . rosuvastatin  10 mg Oral q1800  . sodium chloride flush  3 mL Intravenous Q12H  . tamsulosin  0.4 mg Oral Daily  . ticagrelor  90 mg Oral BID   Continuous Infusions:  PRN Meds:.sodium chloride, acetaminophen, methocarbamol, ondansetron (ZOFRAN) IV, oxyCODONE-acetaminophen, sodium chloride flush    Vitals:   02/03/16 1600 02/03/16 1945 02/03/16 2000 02/04/16 0346  BP: (!) 159/81 (!) 162/86  (!) 151/88  Pulse: (!) 55 (!) 55  (!) 57  Resp: 10 16 12  (!) 21  Temp: 97.7 F (36.5 C) 98.2 F (36.8 C)  97 F (36.1 C)  TempSrc: Oral Oral  Oral  SpO2: 100% 97% 96% 97%  Weight:    183 lb 3.2 oz (83.1 kg)  Height:        Intake/Output Summary (Last 24 hours) at 02/04/16 0748 Last data filed at 02/04/16 0346  Gross per 24 hour  Intake              678 ml  Output             2900 ml  Net            -2222 ml    LABS: Basic Metabolic Panel:  Recent Labs  02/04/16 0530  NA 139  K 4.1  CL 106  CO2 26  GLUCOSE 96  BUN 8  CREATININE 1.03  CALCIUM 9.1   Liver Function Tests: No results for input(s): AST, ALT, ALKPHOS, BILITOT, PROT, ALBUMIN in the last 72 hours. No results for input(s): LIPASE, AMYLASE in the last 72 hours. CBC: No results for input(s): WBC, NEUTROABS, HGB, HCT, MCV, PLT in the last 72 hours. Cardiac Enzymes: No results for input(s): CKTOTAL, CKMB, CKMBINDEX, TROPONINI in the last 72 hours. BNP: Invalid input(s): POCBNP D-Dimer: No results for input(s): DDIMER in the last 72 hours. Hemoglobin A1C: No results for input(s): HGBA1C in the last 72 hours. Fasting Lipid Panel: No results for input(s): CHOL, HDL, LDLCALC, TRIG, CHOLHDL, LDLDIRECT in the last 72  hours. Thyroid Function Tests: No results for input(s): TSH, T4TOTAL, T3FREE, THYROIDAB in the last 72 hours.  Invalid input(s): FREET3 Anemia Panel: No results for input(s): VITAMINB12, FOLATE, FERRITIN, TIBC, IRON, RETICCTPCT in the last 72 hours.  RADIOLOGY: No results found.  PHYSICAL EXAM General: NAD Neck: No JVD, no thyromegaly or thyroid nodule.  Lungs: Clear to auscultation bilaterally with normal respiratory effort. CV: Nondisplaced PMI.  Heart regular S1/S2, no S3/S4, no murmur.  No peripheral edema.  No carotid bruit.  Normal pedal pulses.  Abdomen: Soft, nontender, no hepatosplenomegaly, no distention.  Neurologic: Alert and oriented x 3.  Psych: Normal affect. Extremities: No clubbing or cyanosis. Radial artery cath site benign.   TELEMETRY: Reviewed telemetry pt in NSR  ASSESSMENT AND PLAN: 65 yo with history of CAD s/p remote PCI proximal LAD had cath yesterday due to progressive angina. He had cath yesterday with long 80% stenosis ostial to proximal LAD involving prior stent.  He had PCI with DES.  Doing well this morning, no complaints.  HR in 50s, will not titrate metoprolol.  BP high here but  has not had issue with this at home.  - Continue prior home medications.  - Add Brilinta 90 mg bid x 1 year, then will need to go on Plavix 75 daily long-term after that.  - Will need to monitor BP at home, may benefit from addition of ACEI.  - May go home, followup with me or PA in about 2 wks at [place] 02/04/2016 7:51 AM

## 2016-02-04 NOTE — Discharge Summary (Signed)
Discharge Summary    Patient ID: Marc Montes,  MRN: [id number], DOB/AGE: 01/15/1951 65 y.o.  Admit date: 02/03/2016 Discharge date: 02/04/2016  Primary Care Provider: [name] Primary Cardiologist: Dr. Aundra Dubin  Discharge Diagnoses    Principal Problem:   Angina pectoris [facility]) Active Problems:   Atherosclerosis of coronary artery   Hypercholesterolemia   Syncope   Angina decubitus ([place])   History of Present Illness     Marc Montes is a 65 y.o. male with past medical history of CAD (s/p PCI to LAD in 2002) and HLD who was recently seen in the office for worsening chest discomfort with exertion.   With his presenting symptoms and known CAD, a repeat cardiac catheterization was recommended. The risks and benefits were thoroughly reviewed and he presented to [facility] on 02/03/2016 for the procedure.   Hospital Course     Consultants: None  His catheterization showed an 85% stenosis along the mid-LAD and 40% stenosis along the Ost to Prox LAD. A Promus Premier DES was placed to the Mid LAD with 0% residual stenosis. He tolerated the procedure well and no complications were noted. He was started on DAPT with ASA and Brilinta.   The following morning he denied any episodes of chest pain or shortness of breath. He ambulated over 800 ft with cardiac rehab without any anginal symptoms. Post-procedure creatinine was stable at 1.03. EKG showed no acute ischemia changes.   He was last examined by Dr. Aundra Dubin and deemed stable for discharge. He will continue on DAPT with ASA and Brilinta (30-day card provided by Case Management) along with Imdur, Lopressor, and statin therapy. Two-week Cardiology follow-up has been arranged. He was discharged home in stable condition.    Discharge Vitals Blood pressure (!) 141/84, pulse 65, temperature 98.2 F (36.8 C), temperature source Oral, resp. rate 16, height 5\' 8"  (1.727 m), weight 183 lb 3.2 oz (83.1 kg), SpO2 98 %.  Filed  Weights   02/03/16 0626 02/04/16 0346  Weight: 180 lb (81.6 kg) 183 lb 3.2 oz (83.1 kg)    Labs & Radiologic Studies     CBC No results for input(s): WBC, NEUTROABS, HGB, HCT, MCV, PLT in the last 72 hours. Basic Metabolic Panel  Recent Labs  02/04/16 0530  NA 139  K 4.1  CL 106  CO2 26  GLUCOSE 96  BUN 8  CREATININE 1.03  CALCIUM 9.1     Diagnostic Studies/Procedures     Cardiac Catheterization: 02/04/2016 Left Main  No significant stenosis.  Left Anterior Descending  There is a stent in the LAD just proximal to a moderate 2nd diagonal. There is a long segment of up to 80% stenosis extending from the ostial LAD and into the proximal LAD stent. The greatest area of narrowing appears to be within the stent. Luminal irregularities in the remainder of the LAD.  Ramus Intermedius  Large ramus without significant disease.  Left Circumflex  30% mid, 30% distal LCx stenosis.  Right Coronary Artery  Luminal irregularities.    Long area of the disease from the ostial LAD into a previously-placed proximal LAD stent that is just proximal to D2.  Stenosis appears up to 80%, worst within the stent.  Discussed with Dr Tamala Julian, plan FFR and possible intervention.     Mid LAD lesion, 85 %stenosed.  Ost LAD to Prox LAD lesion, 40 %stenosed.  Prox LAD to Mid LAD lesion, 85 %stenosed.  Post intervention, there is a 0% residual  stenosis.  A STENT PROMUS PREM MR 2.5X38 drug eluting stent was successfully placed, and overlaps previously placed stent.    In-stent restenosis within the proximal LAD 80% stenosis, with FFR 0.72 from contrast-induced hyperemia.  Successful angioplasty and restenting of the mid to ostial LAD with Promus Premier 2.5 x 38 mm DES postdilated to 2.75 mm in diameter. 80% in-stent restenosis reduced to 0% with TIMI grade 3 flow.  Intravascular ultrasound demonstrated a well expanded stent without evidence of malapposition.   RECOMMENDATIONS:   Aspirin  and Brilinta for one year and consideration of longer duration of dual antiplatelet therapy perhaps with aspirin and Plavix beyond that point.  Patient is eligible for discharge in a.m.  Disposition   Pt is being discharged home today in good condition.  Follow-up Plans & Appointments    Follow-up Information    Truitt Merle, NP Follow up on 02/21/2016.   Specialties:  Nurse Practitioner, Interventional Cardiology, Cardiology, Radiology Why:  Cardiology Hospital Follow-Up on 02/21/2016 at 8:45AM. Contact information: [address] [place] [address] [phone]          Discharge Instructions    Amb Referral to Cardiac Rehabilitation    Complete by:  As directed   Diagnosis:  Coronary Stents Comment - referring to [facility] program   Diet - low sodium heart healthy    Complete by:  As directed   Discharge instructions    Complete by:  As directed   PLEASE REMEMBER TO BRING ALL OF YOUR MEDICATIONS TO EACH OF YOUR FOLLOW-UP OFFICE VISITS.  PLEASE ATTEND ALL SCHEDULED FOLLOW-UP APPOINTMENTS.   Activity: Increase activity slowly as tolerated. You may shower, but no soaking baths (or swimming) for 1 week. No driving for 24 hours. No lifting over 5 lbs for 1 week. No sexual activity for 1 week.   You May Return to Work: in 1 week (if applicable)  Wound Care: You may wash cath site gently with soap and water. Keep cath site clean and dry. If you notice pain, swelling, bleeding or pus at your cath site, please call [phone].   Increase activity slowly    Complete by:  As directed      Discharge Medications     Medication List    TAKE these medications   aspirin 81 MG tablet Take 81 mg by mouth daily.   CRESTOR 10 MG tablet Generic drug:  rosuvastatin TAKE ONE TABLET BY MOUTH ONCE DAILY (MAKE AN APPOINTMENT FOR FURTHER REFILLS)   GLUCOSAMINE HCL-MSM PO Take 1 tablet by mouth daily.   isosorbide mononitrate 60 MG 24 hr tablet Commonly known  as:  IMDUR TAKE ONE & ONE-HALF TABLETS BY MOUTH ONCE DAILY (NEEDS  APPT) What changed:  See the new instructions.   metoprolol tartrate 25 MG tablet Commonly known as:  LOPRESSOR Take 0.5 tablets (12.5 mg total) by mouth 2 (two) times daily.   multivitamin tablet Take 1 tablet by mouth daily.   NEURONTIN 600 MG tablet Generic drug:  gabapentin Take 600 mg by mouth 3 (three) times daily.   nitroGLYCERIN 0.4 MG SL tablet Commonly known as:  NITROSTAT Place 1 tablet(0.4 mg) under the tongue every five minutes for chest pain, After 3 doses if chest pain is not relieved call 911   oxyCODONE-acetaminophen 5-325 MG tablet Commonly known as:  PERCOCET/ROXICET Take 1 tablet by mouth every 8 (eight) hours as needed for moderate pain.   ROBAXIN 500 MG tablet Generic drug:  methocarbamol Take 500 mg  by mouth every 8 (eight) hours as needed for muscle spasms.   tamsulosin 0.4 MG Caps capsule Commonly known as:  FLOMAX Take 0.4 mg by mouth daily.   ticagrelor 90 MG Tabs tablet Commonly known as:  BRILINTA Take 1 tablet (90 mg total) by mouth 2 (two) times daily. Notes to patient:  NEW MEDICINE          Allergies Allergies  Allergen Reactions  . Simvastatin Other (See Comments)    Affects mobility  . 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  Signed, Erma Heritage, PA-C 02/04/2016, 5:14 PM

## 2016-02-10 ENCOUNTER — Other Ambulatory Visit: Payer: Self-pay | Admitting: Cardiology

## 2016-02-11 ENCOUNTER — Telehealth: Payer: Self-pay | Admitting: Cardiology

## 2016-02-11 NOTE — Telephone Encounter (Signed)
Patient was dc'd home on 02/04/16 after cardiac cath with stent.  He has not been showering due to his wrist bandage.  Removed bandage while on the phone.  Patient reports area is clean and dry.  Advised no swimming yet or soaking arm but to wash with soap and water and pat dry.  Advised showering is completely fine.  He is aware of f/u on 02/21/16.  He is taking asa and Brilinta.

## 2016-02-11 NOTE — Telephone Encounter (Signed)
New message    Pt called wanting to know how long he has to keep the bandage on his arm? He is wanting to take a shower. Please call.

## 2016-02-18 ENCOUNTER — Ambulatory Visit: Payer: Medicare Other | Admitting: Nurse Practitioner

## 2016-02-21 ENCOUNTER — Ambulatory Visit: Payer: Medicare Other | Admitting: Physician Assistant

## 2016-02-24 ENCOUNTER — Encounter (INDEPENDENT_AMBULATORY_CARE_PROVIDER_SITE_OTHER): Payer: Self-pay

## 2016-02-24 ENCOUNTER — Ambulatory Visit (INDEPENDENT_AMBULATORY_CARE_PROVIDER_SITE_OTHER): Payer: Medicare Other | Admitting: Physician Assistant

## 2016-02-24 ENCOUNTER — Encounter: Payer: Self-pay | Admitting: Physician Assistant

## 2016-02-24 VITALS — BP 122/80 | HR 58 | Ht 68.0 in | Wt 181.4 lb

## 2016-02-24 DIAGNOSIS — E7800 Pure hypercholesterolemia, unspecified: Secondary | ICD-10-CM | POA: Diagnosis not present

## 2016-02-24 DIAGNOSIS — R079 Chest pain, unspecified: Secondary | ICD-10-CM | POA: Diagnosis not present

## 2016-02-24 DIAGNOSIS — I2510 Atherosclerotic heart disease of native coronary artery without angina pectoris: Secondary | ICD-10-CM

## 2016-02-24 DIAGNOSIS — I208 Other forms of angina pectoris: Secondary | ICD-10-CM

## 2016-02-24 NOTE — Progress Notes (Signed)
Cardiology Office Note    Date:  02/24/2016   ID:  Fischer, Boase 1951/02/26, MRN [id number]  PCP:  [name], DO  Cardiologist:   Chief Complaint  Patient presents with  . Follow-up    post cath    History of Present Illness:  Marc Montes is a 65 y.o. male with past medical history of CAD (s/p PCI to LAD in 2002) and HLD who was recently seen in the office for worsening chest discomfort with exertion.   [facility].His catheterization showed an 85% stenosis along the mid-LAD and 40% stenosis along the Ost to Prox LAD. A Promus Premier DES was placed to the Mid LAD with 0% residual stenosis. He tolerated the procedure well and no complications were noted. He was started on DAPT with ASA and Brilinta.   Complains of discomfort right chest when laying down similar to to what brought him into the hospital. He says it's fleeting and goes away in seconds. No chest tightness, heaviness, radiation, shortness of breath, dizziness or presyncope. He hasn't been walking a lot but has been sanding his car without any symptoms. He is hoping to start cardiac rehabilitation in [place] soon.       Past Medical History:  Diagnosis Date  . Basal cell carcinoma of face    "burned off"  . CAD (coronary artery disease)    a. PCI to LAD in 2002 b. 02/2016: Cath w/ 85% stenosis mid-LAD, DES placed.  . Chronic lower back pain   . Exposure to TB   . GERD (gastroesophageal reflux disease)   . History of hiatal hernia   . Hyperlipidemia    simvastatin caused severe myalgias. Tolerated pravastatin.   . 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 Peripheral neuropathy ([place])   . Refusal of blood transfusions as patient is Jehovah's Witness     Past Surgical History:  Procedure Laterality Date  . CARDIAC CATHETERIZATION  01/2010  . CARDIAC CATHETERIZATION N/A 02/03/2016   Procedure: Left Heart Cath and Coronary Angiography;  Surgeon:  Larey Dresser, MD;  Location: [facility];  Service: Cardiovascular;  Laterality: N/A;  . CARDIAC CATHETERIZATION N/A 02/03/2016   Procedure: Intravascular Pressure Wire/FFR Study;  Surgeon: Nelva Bush, MD;  Location: [facility];  Service: Cardiovascular;  Laterality: N/A;  . CARDIAC CATHETERIZATION N/A 02/03/2016   Procedure: Coronary Stent Intervention;  Surgeon: Nelva Bush, MD;  Location: [facility];  Service: Cardiovascular;  Laterality: N/A;  . CARDIAC CATHETERIZATION N/A 02/03/2016   Procedure: Intravascular Ultrasound/IVUS;  Surgeon: Nelva Bush, MD;  Location: [facility];  Service: Cardiovascular;  Laterality: N/A;  . CORONARY ANGIOPLASTY WITH STENT PLACEMENT  05/2001; 02/03/2016   "1 stent; 1 stent"  . MELANOMA EXCISION  2016   upper lip  . POSTERIOR LUMBAR FUSION  10/2001   Thoracolumbar decompressive laminectomy with bilateral L1 transpedicular decompressions and open reduction of L1 fracture. Posterolateral fusion from T11 to L3 using segmental pedicle screw instrumentation/notes 11/15/2010    Current Medications: Outpatient Medications Prior to Visit  Medication Sig Dispense Refill  . aspirin 81 MG tablet Take 81 mg by mouth daily.      . CRESTOR 10 MG tablet TAKE ONE TABLET BY MOUTH ONCE DAILY (MAKE AN APPOINTMENT FOR FURTHER REFILLS) 30 tablet 0  . gabapentin (NEURONTIN) 600 MG tablet Take 600 mg by mouth 2 (two) times daily.     Marland Kitchen  GLUCOSAMINE HCL-MSM PO Take 1 tablet by mouth daily.    . isosorbide mononitrate (IMDUR) 60 MG 24 hr tablet Take 1.5 tablets (90 mg total) by mouth daily. 45 tablet 11  . methocarbamol (ROBAXIN) 500 MG tablet Take 500 mg by mouth every 8 (eight) hours as needed for muscle spasms.     . metoprolol tartrate (LOPRESSOR) 25 MG tablet Take 0.5 tablets (12.5 mg total) by mouth 2 (two) times daily. 30 tablet 6  . Multiple Vitamin (MULTIVITAMIN) tablet Take 1 tablet by mouth daily.      . nitroGLYCERIN (NITROSTAT)  0.4 MG SL tablet Place 1 tablet(0.4 mg) under the tongue every five minutes for chest pain, After 3 doses if chest pain is not relieved call 911 100 tablet 0  . oxyCODONE-acetaminophen (PERCOCET) 5-325 MG per tablet Take 1 tablet by mouth every 8 (eight) hours as needed for moderate pain.     . tamsulosin (FLOMAX) 0.4 MG CAPS capsule Take 0.4 mg by mouth daily.    . ticagrelor (BRILINTA) 90 MG TABS tablet Take 1 tablet (90 mg total) by mouth 2 (two) times daily. 60 tablet 0   No facility-administered medications prior to visit.      Allergies:   Simvastatin and Tuberculin tests   Social History   Social History  . Marital status: Married    Spouse name: N/A  . Number of children: N/A  . Years of education: N/A   Social History Main Topics  . Smoking status: Never Smoker  . Smokeless tobacco: Never Used  . Alcohol use No  . Drug use: No  . Sexual activity: No   Other Topics Concern  . None   Social History Narrative   Retired Dealer, disability from back injury.    Married-Lives in [place]   Alcohol Use - no   Tobacco Use - No.    Smoking Status:  never     Family History:  The patient's   family history includes Heart attack in his father; Stroke in his mother.   ROS:   Please see the history of present illness.    Review of Systems  Constitution: Positive for weakness.  Cardiovascular: Positive for chest pain.   All other systems reviewed and are negative.   PHYSICAL EXAM:   VS:  BP 122/80   Pulse (!) 58   Ht 5\' 8"  (1.727 m)   Wt 181 lb 6.4 oz (82.3 kg)   BMI 27.58 kg/m   Physical Exam  GEN: Well nourished, well developed, in no acute distress  Neck: no JVD, carotid bruits, or masses Cardiac:RRR; no murmurs, rubs, or gallops  Respiratory:  clear to auscultation bilaterally, normal work of breathing GI: soft, nontender, nondistended, + BS Ext: without cyanosis, clubbing, or edema, Good distal pulses bilaterally MS: no deformity or atrophy  Skin:  warm and dry, no rash Psych: euthymic mood, full affect  Wt Readings from Last 3 Encounters:  02/24/16 181 lb 6.4 oz (82.3 kg)  02/04/16 183 lb 3.2 oz (83.1 kg)  01/24/16 187 lb (84.8 kg)      Studies/Labs Reviewed:   EKG:  EKG is  ordered today.  The ekg ordered today demonstrates  Sinus bradycardia 54 bpm otherwise normal  Recent Labs: 01/24/2016: Hemoglobin 14.1; Platelets 208 02/04/2016: BUN 8; Creatinine, Ser 1.03; Potassium 4.1; Sodium 139   Lipid Panel    Component Value Date/Time   CHOL 130 01/24/2016 1638   TRIG 162 (H) 01/24/2016 1638   HDL 42 01/24/2016  1638   CHOLHDL 3.1 01/24/2016 1638   VLDL 32 (H) 01/24/2016 1638   LDLCALC 56 01/24/2016 1638    Additional studies/ records that were reviewed today include:  Cardiac Catheterization: 02/04/2016 Left Main  No significant stenosis.  Left Anterior Descending  There is a stent in the LAD just proximal to a moderate 2nd diagonal. There is a long segment of up to 80% stenosis extending from the ostial LAD and into the proximal LAD stent. The greatest area of narrowing appears to be within the stent. Luminal irregularities in the remainder of the LAD.  Ramus Intermedius  Large ramus without significant disease.  Left Circumflex  30% mid, 30% distal LCx stenosis.  Right Coronary Artery  Luminal irregularities.      Long area of the disease from the ostial LAD into a previously-placed proximal LAD stent that is just proximal to D2.  Stenosis appears up to 80%, worst within the stent.  Discussed with Dr Tamala Julian, plan FFR and possible intervention.     Mid LAD lesion, 85 %stenosed.  Ost LAD to Prox LAD lesion, 40 %stenosed.  Prox LAD to Mid LAD lesion, 85 %stenosed.  Post intervention, there is a 0% residual stenosis.  A STENT PROMUS PREM MR 2.5X38 drug eluting stent was successfully placed, and overlaps previously placed stent.    In-stent restenosis within the proximal LAD 80% stenosis, with FFR 0.72 from  contrast-induced hyperemia.  Successful angioplasty and restenting of the mid to ostial LAD with Promus Premier 2.5 x 38 mm DES postdilated to 2.75 mm in diameter. 80% in-stent restenosis reduced to 0% with TIMI grade 3 flow.  Intravascular ultrasound demonstrated a well expanded stent without evidence of malapposition.    RECOMMENDATIONS:    Aspirin and Brilinta for one year and consideration of longer duration of dual antiplatelet therapy perhaps with aspirin and Plavix beyond that point.  Patient is eligible for discharge in a.m.       ASSESSMENT:    1. Chest pain, unspecified chest pain type   2. Atherosclerosis of native coronary artery of native heart without angina pectoris   3. Hypercholesterolemia      PLAN:  In order of problems listed above:  Chest pain somewhat atypical fleeting and only occurred a few times since discharge. EKG reassuring. Recommend cardiac rehabilitation in [place]. All with Dr. Aundra Dubin in 2 months.  CAD with recent angioplasty and stent to the LAD for in-stent restenosis continue aspirin and Brilinta  Hypercholesterolemia continue Crestor    Medication Adjustments/Labs and Tests Ordered: Current medicines are reviewed at length with the patient today.  Concerns regarding medicines are outlined above.  Medication changes, Labs and Tests ordered today are listed in the Patient Instructions below. Patient Instructions  Medication Instructions:  Your physician recommends that you continue on your current medications as directed. Please refer to the Current Medication list given to you today.   Labwork: -None  Testing/Procedures: -None  Follow-Up: Desiree Lucy, RN, Dr. Claris Gladden nurse will call with appointment for a two month follow up.   Any Other Special Instructions Will Be Listed Below (If Applicable).  Please call cardiac rehab in [place] to set that up.     If you need a refill on your cardiac medications before your  next appointment, please call your pharmacy.      Sumner Boast, PA-C  02/24/2016 9:45 AM    [facility] [address], [place], [place]  [address] Phone: [phone]; Fax: [phone]    

## 2016-02-24 NOTE — Patient Instructions (Signed)
Medication Instructions:  Your physician recommends that you continue on your current medications as directed. Please refer to the Current Medication list given to you today.   Labwork: -None  Testing/Procedures: -None  Follow-Up: Desiree Lucy, RN, Dr. Claris Gladden nurse will call with appointment for a two month follow up.   Any Other Special Instructions Will Be Listed Below (If Applicable).  Please call cardiac rehab in [place] to set that up.     If you need a refill on your cardiac medications before your next appointment, please call your pharmacy.

## 2016-02-24 NOTE — Progress Notes (Signed)
I'll see in 2 months.

## 2016-04-20 ENCOUNTER — Encounter: Payer: Self-pay | Admitting: Cardiology

## 2016-05-02 ENCOUNTER — Ambulatory Visit (INDEPENDENT_AMBULATORY_CARE_PROVIDER_SITE_OTHER): Payer: Medicare Other | Admitting: Cardiology

## 2016-05-02 ENCOUNTER — Encounter: Payer: Self-pay | Admitting: Cardiology

## 2016-05-02 VITALS — BP 120/80 | HR 60 | Ht 68.0 in | Wt 183.0 lb

## 2016-05-02 DIAGNOSIS — I208 Other forms of angina pectoris: Secondary | ICD-10-CM

## 2016-05-02 DIAGNOSIS — I209 Angina pectoris, unspecified: Secondary | ICD-10-CM

## 2016-05-02 DIAGNOSIS — I25119 Atherosclerotic heart disease of native coronary artery with unspecified angina pectoris: Secondary | ICD-10-CM

## 2016-05-02 MED ORDER — ISOSORBIDE MONONITRATE ER 30 MG PO TB24: 30.0000 mg | ORAL_TABLET | Freq: Every day | ORAL | 3 refills | Status: DC

## 2016-05-02 NOTE — Patient Instructions (Signed)
Medication Instructions:  Decrease Imdur (isosorbide) to 30mg  daily  Labwork: none  Testing/Procedures: none  Follow-Up: Your physician wants you to follow-up in: 6 months with Dr End. (April 2018).  You will receive a reminder letter in the mail two months in advance. If you don't receive a letter, please call our office to schedule the follow-up appointment.        If you need a refill on your cardiac medications before your next appointment, please call your pharmacy.

## 2016-05-02 NOTE — Progress Notes (Signed)
Patient ID: Marc Montes, male   DOB: 10/29/1950, 65 y.o.   MRN: [id number] PCP: Dr. Elvina Mattes, [place]  65 yo with history of CAD returns for cardiology followup.  Left heart cath was done in 8/11 given typical angina, showing a long segment from the LAD ostium throughout the proximal LAD with moderate stenosis (60-70% at most). To treat this lesion would require either a long segment of overlapping drug-eluting stents (increasing risk of stent thrombosis over time) or a CABG with LIMA-LAD.  We elected to manage medically as the patient's symptoms had been only with moderate to heavy exertion.  Due to worsening angina, repeat cath was done in 8/17. This showed 80% in-stent restenosis in the proximal LAD that was significant by FFR.  Patient had DES to proximal LAD.   He has been doing well since PCI.  No further chest pain.  Rides 4-5 miles on his bike.  Able to go up hills without problems.  No exertional dyspnea. He has been doing cardiac rehab at the hospital in [place].    Labs (7/11): K 5, creatinine 1.1, HCT 41.9, HDL 44, LDL 92, LFTs normal, TSH normal Labs (8/11): K 4.2, creatinine 0.9 Labs (11/11): K 5.1, creatinine 1.12, LDL 92, HDL 44 Labs (2/12): LDL 57, HDL 45, creatinine 0.97 Labs (7/16): K 3.8, creatinine 1.06, LDL 48, HDL 36 Labs (8/17): K 4.1, creatinine 1.03  Allergies (verified):  1)  ! Zocor (myalgias)  Past History:  Past Medical History: 1. CAD: Cath in 11/02 after he developed exertional chest pain and had an abnormal myoview.  This showed 90% proximal LAD stenosis.  Patient had BMS to LAD.  EF was 55%.  LHC (8/11): radial approach, EF 60%< 60-70% ostial LAD, proximal LAD stent with 50-60% in-stent restenosis, ostium of D2 is compressed by the LAD stent with 80% ostial D2 stenosis.   - LHC (8/17): 80% in-stent restenosis proximal LAD treated with DES; EF 65%.  2. Traumatic L1 burst fracture s/p surgery.  Chronic back pain with disability. 3. Peripheral  neuropathy 4. Hyperlipidemia: simvastatin caused severe myalgias. Tolerated pravastatin and Crestor.  5. Syncope: ?Vasovagal, 1 episode.  6. Melanoma: On face, s/p surgery 2016.   Family History: No CAD  Social History: Retired Dealer, disability from back injury.  Married-Lives outside of [place] Alcohol Use - no Tobacco Use - No.  Smoking Status:  Prior.  ROS: All systems reviewed and negative except as per HPI.   Current Outpatient Prescriptions  Medication Sig Dispense Refill  . aspirin 81 MG tablet Take 81 mg by mouth daily.      . CRESTOR 10 MG tablet TAKE ONE TABLET BY MOUTH ONCE DAILY (MAKE AN APPOINTMENT FOR FURTHER REFILLS) 30 tablet 0  . gabapentin (NEURONTIN) 600 MG tablet Take 600 mg by mouth 2 (two) times daily.     Marland Kitchen GLUCOSAMINE HCL-MSM PO Take 1 tablet by mouth daily.    . methocarbamol (ROBAXIN) 500 MG tablet Take 500 mg by mouth every 8 (eight) hours as needed for muscle spasms.     . metoprolol tartrate (LOPRESSOR) 25 MG tablet Take 0.5 tablets (12.5 mg total) by mouth 2 (two) times daily. 30 tablet 6  . Multiple Vitamin (MULTIVITAMIN) tablet Take 1 tablet by mouth daily.      . nitroGLYCERIN (NITROSTAT) 0.4 MG SL tablet Place 1 tablet(0.4 mg) under the tongue every five minutes for chest pain, After 3 doses if chest pain is not relieved call 911 100 tablet 0  .  oxyCODONE-acetaminophen (PERCOCET) 5-325 MG per tablet Take 1 tablet by mouth every 8 (eight) hours as needed for moderate pain.     . tamsulosin (FLOMAX) 0.4 MG CAPS capsule Take 0.4 mg by mouth daily.    . ticagrelor (BRILINTA) 90 MG TABS tablet Take 1 tablet (90 mg total) by mouth 2 (two) times daily. 60 tablet 0  . isosorbide mononitrate (IMDUR) 30 MG 24 hr tablet Take 1 tablet (30 mg total) by mouth daily. 90 tablet 3   No current facility-administered medications for this visit.     BP 120/80 (BP Location: Right Arm, Patient Position: Sitting, Cuff Size: Normal)   Pulse 60   Ht 5\' 8"  (1.727  m)   Wt 183 lb (83 kg)   BMI 27.83 kg/m  General: NAD Neck: No JVD, no thyromegaly or thyroid nodule.  Lungs: Clear to auscultation bilaterally with normal respiratory effort. CV: Nondisplaced PMI.  Heart regular S1/S2, no S3/S4, no murmur.  No peripheral edema.  No carotid bruit.  Normal pedal pulses.  Abdomen: Soft, nontender, no hepatosplenomegaly, no distention.   Neurologic: Alert and oriented x 3.  Psych: Normal affect. Extremities: No clubbing or cyanosis.   Assessment/Plan: 1. CAD:  Status post DES to in-stent restenosis in proximal LAD in 8/17.  No further anginal chest pain. - Continue Crestor, metoprolol, and aspirin.   - Continue ticagrelor x 1 year.  - Decrease Imdur to 30 mg daily, and if no further chest pain may stop.  2. Hyperlipidemia: Continue Crestor.   Followup 6 months with Dr End given my transition to CHF clinic.   [name] 05/02/2016

## 2016-05-29 ENCOUNTER — Telehealth: Payer: Self-pay | Admitting: Cardiology

## 2016-05-29 NOTE — Telephone Encounter (Signed)
Pt calling regarding having a med-can't remember the name-that was reduced and he wants to increase it back to what he was taking before -pls call  [phone]

## 2016-05-29 NOTE — Telephone Encounter (Signed)
LMTCB

## 2016-05-30 NOTE — Telephone Encounter (Signed)
Rang x8, no answer or voicemail.

## 2016-05-31 NOTE — Telephone Encounter (Signed)
I attempted to contact this patient for a third time without success.  I left message for patient to contact our office if there were further questions or clarification was still needed.

## 2016-06-01 ENCOUNTER — Telehealth: Payer: Self-pay | Admitting: Cardiology

## 2016-06-01 NOTE — Telephone Encounter (Signed)
LMTCB

## 2016-06-01 NOTE — Telephone Encounter (Signed)
Follow Up: ° ° ° °Returning your call from yesterday. °

## 2016-06-02 ENCOUNTER — Telehealth: Payer: Self-pay | Admitting: Cardiology

## 2016-06-02 NOTE — Telephone Encounter (Signed)
LMTCB

## 2016-06-02 NOTE — Telephone Encounter (Signed)
New message  Pt is returning call  Please call

## 2016-06-06 MED ORDER — ISOSORBIDE MONONITRATE ER 30 MG PO TB24: ORAL_TABLET | ORAL | 3 refills | Status: DC

## 2016-06-06 MED ORDER — ISOSORBIDE MONONITRATE ER 60 MG PO TB24: ORAL_TABLET | ORAL | 3 refills | Status: DC

## 2016-06-06 NOTE — Telephone Encounter (Signed)
Duplicate, see [id number] phone note

## 2016-06-06 NOTE — Telephone Encounter (Signed)
I called patient back and advised Dr. Aundra Dubin agreed to medication change. I sent rx's to pharmacy. Pt aware rx's sent and he voiced understanding.

## 2016-06-06 NOTE — Telephone Encounter (Signed)
That would be fine 

## 2016-06-06 NOTE — Telephone Encounter (Signed)
I spoke with patient. He states you decreased Imdur 90 mg to 30 mg.  He states he started having chest pain again. He reports increasing dose back to 90 mg and it has stopped.  He is requesting you increase the dose again and send new rx to pharmacy. Please advise.

## 2016-07-06 ENCOUNTER — Other Ambulatory Visit: Payer: Self-pay | Admitting: *Deleted

## 2016-07-06 NOTE — Telephone Encounter (Addendum)
PT CANT AFFORD MEDICATION, PLEASE CALL AND DISCUSS. WILL PLACE 8 SAMPLE BOTTLES AT FRONT DESK UNTIL RESOLVED

## 2016-07-06 NOTE — Telephone Encounter (Signed)
Thanks Brandy for providing samples, which medication is it that he might need assistance with?

## 2016-07-07 NOTE — Telephone Encounter (Signed)
Stent placed in Aug 2017 - less than one year ago. Will defer to Dr. Aundra Dubin about change in medication if appropriate. He may qualify for patient assistance and message has been routed to [employer] to assist with this.

## 2016-07-10 NOTE — Telephone Encounter (Signed)
Would try to get patient assistance, ideally continue Brilinta 1 year post-stent.

## 2016-07-10 NOTE — Telephone Encounter (Signed)
I will forward to [facility] to see if she can help with patient assistance for Brilinta.

## 2016-08-04 ENCOUNTER — Telehealth: Payer: Self-pay | Admitting: *Deleted

## 2016-08-04 NOTE — Telephone Encounter (Signed)
If he cannot get coverage for ticagrelor, can stop it when he runs out and start Plavix 75 mg daily.

## 2016-08-04 NOTE — Telephone Encounter (Signed)
I received fax from [facility] [employer], wrong pt name on form. I spoke with Jaci Standard at [facility] [employer] and he is faxing another form with correct information. I asked Jaci Standard to confirm that once this information is received the patient would be eligible to receive Brilinta at no cost. Jaci Standard states if pt has Medicare/Medicaide or has prescription coverage, the patient qualify for assistance if he spent 3% of his income on medications

## 2016-08-04 NOTE — Telephone Encounter (Signed)
Patient's wife states pt does have Medicare and prescription coverage.  She is aware that pt will have to spend 3% of their household income on medications before they would qualify for patient assistance. She states she will need to talk with pt to see if this would be an option for him. She states  Brilinta is going to cost them about $500 for a 3 month supply.

## 2016-08-04 NOTE — Telephone Encounter (Signed)
Pt's wife aware that I will complete application for patient assistance for Brilinta. Pt's wife requested that I ask Dr Aundra Dubin if there is an alternative for Brilinta that may be less expensive since patient may not qualify for patient assistance through  [place].  Pt's wife advised I will forward to Dr Aundra Dubin for review.

## 2016-08-04 NOTE — Telephone Encounter (Signed)
Follow up    Pt wife verbalized that she wants her husband to still see Dr.Mclean and that she want .  to know about the forms that she sent in to have it filled out to lower mediation amount

## 2016-08-04 NOTE — Telephone Encounter (Signed)
Pt's wife states she mailed income information about 2 weeks ago and is waiting to hear the status of the patient assistance application for Brilinta. I spoke with [facility] at [place], [phone]. Dorothyann Peng states one page of the application is missing that includes prescription information and physician information. Dorothyann Peng is faxing information needed to complete the application and states once this is received pt should be eligible to get the medication free of charge through [employer].  Dr Aundra Dubin had recommended pt see Dr End in follow-up, pt has been scheduled to see Dr End, pt's wife is fine with this.  LMTCB for pt's wife to let her know status of patient assistance application.

## 2016-08-04 NOTE — Telephone Encounter (Signed)
LM for wife --- Dr Aundra Dubin has recommended Plavix 75mg  daily when he runs out of Brilinta.                   ---I will complete application for Brilinta and fax to company once signed by Dr Aundra Dubin.

## 2016-08-04 NOTE — Telephone Encounter (Signed)
Patients wife left a msg on the refill vm stating that they had submitted a form to the office to see if the patient could get the brilinta at a lower cost. She is calling to follow up on the status of this as the patient has one week of medication left. She did not leave any further information.

## 2016-08-23 MED ORDER — CLOPIDOGREL BISULFATE 75 MG PO TABS: 75.0000 mg | ORAL_TABLET | Freq: Every day | ORAL | 1 refills | Status: DC

## 2016-08-23 NOTE — Telephone Encounter (Signed)
Follow up      Returning a call to the nurse to discuss medication

## 2016-08-23 NOTE — Telephone Encounter (Signed)
I spoke with patient's wife.  Pt's wife states pt does not want to continue with Brilinta once current supply runs out, pt prefers to go ahead and change over to Plavix.  Pt's wife advised to finish current supply of Brilinta (about a week) then start Plavix 75mg  daily.

## 2016-08-23 NOTE — Telephone Encounter (Signed)
LMTCB for pt's wife to discuss Brilinta/Plavix.

## 2016-09-01 ENCOUNTER — Telehealth: Payer: Self-pay | Admitting: Internal Medicine

## 2016-09-01 MED ORDER — TICAGRELOR 90 MG PO TABS: 90.0000 mg | ORAL_TABLET | Freq: Two times a day (BID) | ORAL | 3 refills | Status: DC

## 2016-09-01 NOTE — Telephone Encounter (Signed)
Pt and wife are aware that Dr. Saunders Revel agreed for pt to go back on Brillinta 90 mg BID , prescription for 90 days sent to [facility] at [place]. Pt and wife are aware.

## 2016-09-01 NOTE — Telephone Encounter (Signed)
Pt was taking Brillinta 90 mg . On 2/21 pt requested to changed medication to Plavix due to cost. Today pt's wife called stating that pt wants to switch back to [place]. Pt has a coupon for Brillinta for $18.00 Dollars for 3 moths supply and for  each and every refill. Pt and wife are aware that I let Dr End know to see  if its okay for pt to go back on Brillinta medication.

## 2016-09-01 NOTE — Telephone Encounter (Signed)
New message       *STAT* If patient is at the pharmacy, call can be transferred to refill team.   1. Which medications need to be refilled? (please list name of each medication and dose if known) brilinta 2. Which pharmacy/location (including street and city if local pharmacy) is medication to be sent to? [facility] in [place] [phone] 3. Do they need a 30 day or 90 day supply? 90 day Pt now has a coupon and want to switch back to brilinta from plavix.  Call if there is a problem

## 2016-09-01 NOTE — Telephone Encounter (Signed)
It is fine to switch back to ticagrelor 90 mg BID, if that is what Marc Montes prefers. Thanks.

## 2016-09-08 ENCOUNTER — Telehealth: Payer: Self-pay

## 2016-09-08 NOTE — Telephone Encounter (Signed)
Received approval on the pts Brilinta from [employer]. Authorization is good until 09/08/2017.

## 2016-09-13 NOTE — Telephone Encounter (Signed)
**Note De-Identified Cyntia Staley Obfuscation** I have faxed approval letter to the pts pharmacy at [phone] [facility] in [place], [place]).

## 2016-10-17 ENCOUNTER — Encounter: Payer: Self-pay | Admitting: Internal Medicine

## 2016-11-06 ENCOUNTER — Encounter: Payer: Self-pay | Admitting: Internal Medicine

## 2016-11-06 ENCOUNTER — Ambulatory Visit (INDEPENDENT_AMBULATORY_CARE_PROVIDER_SITE_OTHER): Payer: Medicare Other | Admitting: Internal Medicine

## 2016-11-06 ENCOUNTER — Encounter (INDEPENDENT_AMBULATORY_CARE_PROVIDER_SITE_OTHER): Payer: Self-pay

## 2016-11-06 VITALS — BP 110/68 | HR 52 | Ht 69.0 in | Wt 178.0 lb

## 2016-11-06 DIAGNOSIS — E785 Hyperlipidemia, unspecified: Secondary | ICD-10-CM | POA: Diagnosis not present

## 2016-11-06 DIAGNOSIS — I2510 Atherosclerotic heart disease of native coronary artery without angina pectoris: Secondary | ICD-10-CM | POA: Diagnosis not present

## 2016-11-06 NOTE — Patient Instructions (Signed)
Medication Instructions:  Your physician recommends that you continue on your current medications as directed. Please refer to the Current Medication list given to you today.   Labwork: Please ask your primary care doctor to send a copy of your lab,especially your lipid profile (cholesterol) to Dr End. Fax [phone]  Testing/Procedures: None   Follow-Up: Your physician wants you to follow-up in: 6 months with Dr End. (November 2018).  You will receive a reminder letter in the mail two months in advance. If you don't receive a letter, please call our office to schedule the follow-up appointment.        If you need a refill on your cardiac medications before your next appointment, please call your pharmacy.

## 2016-11-06 NOTE — Progress Notes (Addendum)
Patient ID: Marc Montes, male   DOB: September 16, 1950, 66 y.o.   MRN: [id number] PCP: Dr. Elvina Mattes, Elder Cyphers  66 y.o. man with history of CAD returns for cardiology followup.  Left heart cath was done in 8/11 given typical angina, showing a long segment from the LAD ostium throughout the proximal LAD with moderate stenosis (60-70% at most). To treat this lesion would require either a long segment of overlapping drug-eluting stents (increasing risk of stent thrombosis over time) or a CABG with LIMA-LAD.  We elected to manage medically as the patient's symptoms had been only with moderate to heavy exertion.  Due to worsening angina, repeat cath was done in 8/17. This showed 80% in-stent restenosis in the proximal LAD that was significant by FFR.  Patient had DES to proximal LAD. He was last seen by Dr. [name] in 04/2016, which time he was doing well. At that visit, isosorbide mononitrate was decreased to 30 mg daily. 4 psych, Marc Montes had recurrent angina with de-escalation of isosorbide mononitrate. He was returned to 60 mg daily with resolution of his chest pain.  Today, Marc Montes reports feeling quite well. He remains very active around the house and in the shop. He has no exertional symptoms. He specifically denies chest pain, shortness of breath, palpitations, lightheadedness, claudication, edema, orthopnea, and PND. He completed cardiac rehabilitation without any difficulty. He is compliant with his medications, including dual antiplatelet therapy. Of note, he was switched from to ticagrelor to clopidogrel due to financial constraints. He has not had any significant bleeding, though he has noted a few streaks of blood when wiping after bowel movements. His wife is trying to convince Marc Montes to undergo colonoscopy this year. He has not had any syncope, though he has fallen in the past due to neuropathy in his legs.  EKG: Sinus bradycardia (HR 52 bpm). No significant abnormalities.  Labs (7/11): K 5,  creatinine 1.1, HCT 41.9, HDL 44, LDL 92, LFTs normal, TSH normal Labs (8/11): K 4.2, creatinine 0.9 Labs (11/11): K 5.1, creatinine 1.12, LDL 92, HDL 44 Labs (2/12): LDL 57, HDL 45, creatinine 0.97 Labs (7/16): K 3.8, creatinine 1.06, LDL 48, HDL 36 Labs (7/17): K4.5, creatinine 2, TC 130, HDL 42, LDL 56, TG 162 Labs (8/17): K 4.1, creatinine 1.03  Allergies (verified):  1)  ! Zocor (myalgias)  Past History:  Past Medical History: 1. CAD: Cath in 11/02 after he developed exertional chest pain and had an abnormal myoview.  This showed 90% proximal LAD stenosis.  Patient had BMS to LAD.  EF was 55%.  LHC (8/11): radial approach, EF 60%< 60-70% ostial LAD, proximal LAD stent with 50-60% in-stent restenosis, ostium of D2 is compressed by the LAD stent with 80% ostial D2 stenosis.   - LHC (8/17): 80% in-stent restenosis proximal LAD treated with DES; EF 65%.  2. Traumatic L1 burst fracture s/p surgery.  Chronic back pain with disability. 3. Peripheral neuropathy 4. Hyperlipidemia: simvastatin caused severe myalgias. Tolerated pravastatin and Crestor.  5. Syncope: ?Vasovagal, 1 episode.  6. Melanoma: On face, s/p surgery 2016.   Family History: No CAD  Social History: Retired Dealer, disability from back injury.  Married-Lives outside of [place] Alcohol Use - no Tobacco Use - No.  Smoking Status:  Prior.  ROS: All systems reviewed and negative except as per HPI.   Current Outpatient Prescriptions  Medication Sig Dispense Refill  . aspirin 81 MG tablet Take 81 mg by mouth daily.      Marland Kitchen  clopidogrel (PLAVIX) 75 MG tablet Take 75 mg by mouth daily.    . CRESTOR 10 MG tablet TAKE ONE TABLET BY MOUTH ONCE DAILY (MAKE AN APPOINTMENT FOR FURTHER REFILLS) 30 tablet 0  . gabapentin (NEURONTIN) 600 MG tablet Take 600 mg by mouth 2 (two) times daily.     Marland Kitchen GLUCOSAMINE HCL-MSM PO Take 1 tablet by mouth daily.    . isosorbide mononitrate (IMDUR) 60 MG 24 hr tablet Take 60 mg by mouth  daily.    . methocarbamol (ROBAXIN) 500 MG tablet Take 500 mg by mouth every 8 (eight) hours as needed for muscle spasms.     . metoprolol tartrate (LOPRESSOR) 25 MG tablet Take 0.5 tablets (12.5 mg total) by mouth 2 (two) times daily. 30 tablet 6  . Multiple Vitamin (MULTIVITAMIN) tablet Take 1 tablet by mouth daily.      . nitroGLYCERIN (NITROSTAT) 0.4 MG SL tablet Place 1 tablet(0.4 mg) under the tongue every five minutes for chest pain, After 3 doses if chest pain is not relieved call 911 100 tablet 0  . oxyCODONE-acetaminophen (PERCOCET) 5-325 MG per tablet Take 1 tablet by mouth every 8 (eight) hours as needed for moderate pain.     . tamsulosin (FLOMAX) 0.4 MG CAPS capsule Take 0.4 mg by mouth daily.     No current facility-administered medications for this visit.     BP 100/70   Pulse (!) 52   Ht 5\' 9"  (1.753 m)   Wt 178 lb (80.7 kg)   BMI 26.29 kg/m   Repeat BP: 110/68 General: Well-developed, well-nourished man, seated comfortably in the exam room. He is accompanied by his wife. Neck: Supple without lymphadenopathy, thyromegaly, JVD, or HJR.  Lungs: Normal work of breathing. Clear bilaterally without wheezes or crackles. CV: Regular rate and rhythm without murmurs, rubs, or gallops. Nondisplaced PMI. Abdomen: Bowel sounds present. Soft, nontender, nondistended without hepatosplenomegaly. Neurologic: Alert and oriented x 3.  Psych: Normal affect. Extremities: No lower extremity edema. 2+ radial and pedal pulses bilaterally.  Assessment/Plan: 1. CAD:  Patient is doing well following DES to proximal/mid LAD last August. He is tolerating aspirin and clopidogrel well. No further chest pain with isosorbide mononitrate 60 mg daily. -Continue DAPT with aspirin and clopidogrel through at least 02/02/17. Thereafter, clopidogrel could be held for elective procedures, such as colonoscopy. He will need to continue lifelong aspirin. Given history of falls stemming from lower extremity  neuropathy, we will need to carefully weigh the risks and benefits of indefinite DAPT. -Continue aggressive secondary prevention and medical therapy for chronic stable angina. He is tolerating mild bradycardia well; we will therefore continue with low-dose metoprolol plus isosorbide mononitrate. 2. Hyperlipidemia: Continue Crestor. I have asked Marc Montes to have his PCP sent results from upcoming labs, including lipid panel, to our office.  Follow-up: Return to clinic in 6 months.  Shaena Parkerson 11/06/2016

## 2017-02-12 ENCOUNTER — Other Ambulatory Visit: Payer: Self-pay | Admitting: Cardiology

## 2017-02-14 NOTE — Telephone Encounter (Signed)
Received refill request for Plavix 75 mg qd. Just wanting to make sure it was OK to refill. See Dr. Darnelle Bos note from 11/06/16 OV (below). Thank you   Assessment/Plan: 1. CAD:  Patient is doing well following DES to proximal/mid LAD last August. He is tolerating aspirin and clopidogrel well. No further chest pain with isosorbide mononitrate 60 mg daily. -Continue DAPT with aspirin and clopidogrel through at least 02/02/17. Thereafter, clopidogrel could be held for elective procedures, such as colonoscopy. He will need to continue lifelong aspirin. Given history of falls stemming from lower extremity neuropathy, we will need to carefully weigh the risks and benefits of indefinite DAPT. -Continue aggressive secondary prevention and medical therapy for chronic stable angina. He is tolerating mild bradycardia well; we will therefore continue with low-dose metoprolol plus isosorbide mononitrate. 2. Hyperlipidemia: Continue Crestor. I have asked Mr. Mish to have his PCP sent results from upcoming labs, including lipid panel, to our office.  Follow-up: Return to clinic in 6 months.  Christopher End 11/06/2016

## 2017-02-15 NOTE — Telephone Encounter (Signed)
Dr End -he is requesting a refill for clopidogrel. Do you want him to continue this?

## 2017-07-08 ENCOUNTER — Other Ambulatory Visit: Payer: Self-pay | Admitting: Cardiology

## 2017-07-10 ENCOUNTER — Other Ambulatory Visit: Payer: Self-pay | Admitting: Internal Medicine

## 2017-07-10 MED ORDER — ISOSORBIDE MONONITRATE ER 60 MG PO TB24: 60.0000 mg | ORAL_TABLET | Freq: Every day | ORAL | 1 refills | Status: DC

## 2017-07-25 NOTE — Progress Notes (Signed)
Follow-up Outpatient Visit Date: 07/30/2017  Primary Care Provider: [name], DO [address] [place] [address]  Chief Complaint: Follow-up coronary artery disease  HPI:  Marc Montes is a 67 y.o. year-old male with history of coronary artery disease status post PCI to the proximal LAD (BMS in 2002 and DES for ISR in 2017), hyperlipidemia, peripheral neuropathy, and burst fracture of L1 with chronic pain, who presents for follow-up of coronary artery disease. I last saw him in 10/2016. Since then, he has done well with the exception of a single episode of chest pain a few months ago. It occurred while he was cutting wood and resolved promptly after a single sublingual NTG. He has not had any further chest pain. He also denies shortness of breath, palpitations, lightheadedness, and edema. He continues to have some neuropathy and gait instability. He thinks that he has stumbled/falledn 2-3 times since our last visit. He remains on ASA and clopidogrel without significant bleeding.  --------------------------------------------------------------------------------------------------  Past Medical/Surgical History: 1. CAD: Cath in 11/02 after he developed exertional chest pain and had an abnormal myoview.  This showed 90% proximal LAD stenosis.  Patient had BMS to LAD.  EF was 55%.  LHC (8/11): radial approach, EF 60%< 60-70% ostial LAD, proximal LAD stent with 50-60% in-stent restenosis, ostium of D2 is compressed by the LAD stent with 80% ostial D2 stenosis.   - LHC (8/17): 80% in-stent restenosis proximal LAD treated with DES; EF 65%.  2. Traumatic L1 burst fracture s/p surgery.  Chronic back pain with disability. 3. Peripheral neuropathy 4. Hyperlipidemia: simvastatin caused severe myalgias. Tolerated pravastatin and Crestor.  5. Syncope: ?Vasovagal, 1 episode.  6. Melanoma: On face, s/p surgery 2016.   Current Meds  Medication Sig  . aspirin 81 MG tablet Take 81 mg  by mouth daily.    . clopidogrel (PLAVIX) 75 MG tablet Take 75 mg by mouth daily.  . clopidogrel (PLAVIX) 75 MG tablet TAKE 1 TABLET BY MOUTH ONCE DAILY  . CRESTOR 10 MG tablet TAKE ONE TABLET BY MOUTH ONCE DAILY (MAKE AN APPOINTMENT FOR FURTHER REFILLS)  . gabapentin (NEURONTIN) 600 MG tablet Take 600 mg by mouth 2 (two) times daily.   Marland Kitchen GLUCOSAMINE HCL-MSM PO Take 1 tablet by mouth daily.  . isosorbide mononitrate (IMDUR) 60 MG 24 hr tablet Take 1 tablet (60 mg total) by mouth daily.  . methocarbamol (ROBAXIN) 500 MG tablet Take 500 mg by mouth every 8 (eight) hours as needed for muscle spasms.   . metoprolol tartrate (LOPRESSOR) 25 MG tablet Take 0.5 tablets (12.5 mg total) by mouth 2 (two) times daily.  . Multiple Vitamin (MULTIVITAMIN) tablet Take 1 tablet by mouth daily.    . nitroGLYCERIN (NITROSTAT) 0.4 MG SL tablet Place 1 tablet(0.4 mg) under the tongue every five minutes for chest pain, After 3 doses if chest pain is not relieved call 911  . oxyCODONE-acetaminophen (PERCOCET) 5-325 MG per tablet Take 1 tablet by mouth every 8 (eight) hours as needed for moderate pain.   . tamsulosin (FLOMAX) 0.4 MG CAPS capsule Take 0.4 mg by mouth daily.    Allergies: Simvastatin and Tuberculin tests  Social History   Socioeconomic History  . Marital status: Married    Spouse name: Not on file  . Number of children: Not on file  . Years of education: Not on file  . Highest education level: Not on file  Social Needs  . Financial resource strain: Not on file  .  Food insecurity - worry: Not on file  . Food insecurity - inability: Not on file  . Transportation needs - medical: Not on file  . Transportation needs - non-medical: Not on file  Occupational History  . Not on file  Tobacco Use  . Smoking status: Never Smoker  . Smokeless tobacco: Never Used  Substance and Sexual Activity  . Alcohol use: No  . Drug use: No  . Sexual activity: No  Other Topics Concern  . Not on file  Social  History Narrative   Retired Dealer, disability from back injury.    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    Smoking Status:  never    Family History  Problem Relation Age of Onset  . Stroke Mother   . Heart attack Father     Review of Systems: A 12-system review of systems was performed and was negative except as noted in the HPI.  --------------------------------------------------------------------------------------------------  Physical Exam: BP 100/64   Pulse 60   Ht 5\' 9"  (1.753 m)   Wt 186 lb 6.4 oz (84.6 kg)   SpO2 93%   BMI 27.53 kg/m   General:  Overweight man, seated comfortably in the exam room. He is accompanied by his wife. HEENT: No conjunctival pallor or scleral icterus. Moist mucous membranes.  OP clear. Neck: Supple without lymphadenopathy, thyromegaly, JVD, or HJR. Lungs: Normal work of breathing. Clear to auscultation bilaterally without wheezes or crackles. Heart: Regular rate and rhythm without murmurs, rubs, or gallops. Non-displaced PMI. Abd: Bowel sounds present. Soft, NT/ND without hepatosplenomegaly Ext: No lower extremity edema. Radial, PT, and DP pulses are 2+ bilaterally. Skin: Warm and dry without rash.  EKG:  NSR without significant abnormalities or changes from prior tracing in 10/2016.  Lab Results  Component Value Date   WBC 7.3 01/24/2016   HGB 14.1 01/24/2016   HCT 41.3 01/24/2016   MCV 87.1 01/24/2016   PLT 208 01/24/2016    Lab Results  Component Value Date   NA 139 02/04/2016   K 4.1 02/04/2016   CL 106 02/04/2016   CO2 26 02/04/2016   BUN 8 02/04/2016   CREATININE 1.03 02/04/2016   GLUCOSE 96 02/04/2016    Lab Results  Component Value Date   CHOL 130 01/24/2016   HDL 42 01/24/2016   LDLCALC 56 01/24/2016   TRIG 162 (H) 01/24/2016   CHOLHDL 3.1 01/24/2016    --------------------------------------------------------------------------------------------------  ASSESSMENT AND  PLAN: Coronary artery disease with stable angina [name] reports only a single episode of exertional chest pain requiring NTG since 10/2016. Pain is otherwise well-controlled with antianginal regimen consistent of isosorbide mononitrate 60 mg daily and metoprolol tartrate 12.5 mg BID.EKG today is stable. We discussed risks and benefits of indefinite DAPT, given significant CAD and history of falls. Mr. Lada would like to continue with ASA and clopidogrel. If falls become more frequent, we will need to consider stopping clopidogrel.  Hyperlipidemia Continue rosuvastatin 10 mg daily. We will request most recent lipid panel from Mr. Mederos PCP.  Follow-up: Return to clinic in 6 months.  Marc Bush, Marc Montes 07/30/2017 7:17 PM

## 2017-07-30 ENCOUNTER — Encounter: Payer: Self-pay | Admitting: Internal Medicine

## 2017-07-30 ENCOUNTER — Encounter (INDEPENDENT_AMBULATORY_CARE_PROVIDER_SITE_OTHER): Payer: Self-pay

## 2017-07-30 ENCOUNTER — Ambulatory Visit (INDEPENDENT_AMBULATORY_CARE_PROVIDER_SITE_OTHER): Payer: Medicare Other | Admitting: Internal Medicine

## 2017-07-30 VITALS — BP 100/64 | HR 60 | Ht 69.0 in | Wt 186.4 lb

## 2017-07-30 DIAGNOSIS — I25118 Atherosclerotic heart disease of native coronary artery with other forms of angina pectoris: Secondary | ICD-10-CM | POA: Diagnosis not present

## 2017-07-30 DIAGNOSIS — E785 Hyperlipidemia, unspecified: Secondary | ICD-10-CM | POA: Diagnosis not present

## 2017-07-30 NOTE — Patient Instructions (Addendum)
Medication Instructions:  Your physician recommends that you continue on your current medications as directed. Please refer to the Current Medication list given to you today.  -- If you need a refill on your cardiac medications before your next appointment, please call your pharmacy. --  Labwork: None ordered  Testing/Procedures: None ordered  Follow-Up: Your physician wants you to follow-up in: 6 months with Dr. End.    You will receive a reminder letter in the mail two months in advance. If you don't receive a letter, please call our office to schedule the follow-up appointment.  Thank you for choosing [facility]!!    Any Other Special Instructions Will Be Listed Below (If Applicable).         

## 2017-08-06 ENCOUNTER — Encounter: Payer: Self-pay | Admitting: Internal Medicine

## 2017-08-06 NOTE — Progress Notes (Signed)
[place]  Outside records received from Mr. Grunewald PCP. Pertinent lab results below:  Lipid panel (05/10/17): Total cholesterol 134, triglycerides 120, HDL 45, LDL 65  TSH (05/09/17): 1.890  CMP (05/09/17): Na 143, K 4.7, Cl 106, CO2 27BUN 10, creatinine 1.08, Ca 9.5 AST 26, ALT 30, Alk phos 80, Tbili 0.3, total protein 6.9, albumin 4.4  CBC (05/09/17): WBC 7.3, HGB 14.2, HCT 43.4, PLT 227  Nelva Bush, MD [facility] Pager: [phone] 08/06/17 @ 7:42 AM

## 2017-08-11 ENCOUNTER — Other Ambulatory Visit: Payer: Self-pay | Admitting: Internal Medicine

## 2017-11-07 ENCOUNTER — Telehealth: Payer: Self-pay | Admitting: Internal Medicine

## 2017-11-07 NOTE — Telephone Encounter (Signed)
New Message:       Pt is wanting to know if he should have labs done on the day of appt 7/12.

## 2017-11-07 NOTE — Telephone Encounter (Signed)
Continuation:  Informed patient that if he needs labs drawn, we will do that on 7/12 (appointment date).

## 2017-11-07 NOTE — Telephone Encounter (Signed)
Spoke with patient.

## 2018-01-02 ENCOUNTER — Other Ambulatory Visit: Payer: Self-pay | Admitting: Internal Medicine

## 2018-01-04 NOTE — Telephone Encounter (Signed)
Refill Request.  

## 2018-01-11 ENCOUNTER — Telehealth: Payer: Self-pay | Admitting: *Deleted

## 2018-01-11 ENCOUNTER — Encounter: Payer: Self-pay | Admitting: Internal Medicine

## 2018-01-11 ENCOUNTER — Ambulatory Visit (INDEPENDENT_AMBULATORY_CARE_PROVIDER_SITE_OTHER): Payer: Medicare Other | Admitting: Internal Medicine

## 2018-01-11 VITALS — BP 108/68 | HR 50 | Ht 68.0 in | Wt 180.0 lb

## 2018-01-11 DIAGNOSIS — E785 Hyperlipidemia, unspecified: Secondary | ICD-10-CM

## 2018-01-11 DIAGNOSIS — I25118 Atherosclerotic heart disease of native coronary artery with other forms of angina pectoris: Secondary | ICD-10-CM | POA: Diagnosis not present

## 2018-01-11 NOTE — Telephone Encounter (Signed)
lvm for Dr. Elvina Mattes nurse @ [phone] to fax recent labs for pt to office with Dr.End's name on fax .  Will route to Frederik Schmidt, RN, to follow.

## 2018-01-11 NOTE — Patient Instructions (Signed)
Medication Instructions:  Your physician has recommended you make the following change in your medication:  1. Discontinue Metoprolol    Labwork: -None  Testing/Procedures: -None  Follow-Up: Your physician recommends that you keep your scheduled  follow-up appointment with Dr. Saunders Revel.   Any Other Special Instructions Will Be Listed Below (If Applicable).  Will call PCP to get recent labs.    Try to hydrate with water and drink less soda.    If you need a refill on your cardiac medications before your next appointment, please call your pharmacy.

## 2018-01-11 NOTE — Progress Notes (Signed)
Follow-up Outpatient Visit Date: 01/11/2018  Primary Care Provider: [name], [address] [place] [address]  Chief Complaint: Follow-up coronary artery disease  HPI:  [name] is a 67 y.o. year-old male with history of coronary artery disease status post PCI to the proximal LAD (BMS in 2002 and DES for ISR in 2017), hyperlipidemia, peripheral neuropathy, and burst fracture of L1 with chronic pain, who presents for follow-up of CAD.  I last saw him in January at which time he reported a single episode of chest pain few months before visit.  It occurred while he was cutting wood and resolved promptly after a single dose of sublingual nitroglycerin.  We agreed to defer additional treatment antianginal regimen consisting of metoprolol and isosorbide mononitrate.  Today, Mr. [name] reports feeling well.  He notes a single episode of shortness of breath and his chest "feeling funny" that awoke him in the early morning hours of July 4th.  After feeling uncomfortably for several minutes, he drank something cold with prompt resolution of the pain.  He felt as though something was affecting his esophagus, as he has intermittently had acid reflux in the past that seems to be worse when he lies on his right side.  He has not had any further symptoms.  He denies exertional chest pain and shortness of breath.  He had one episode of dizziness and nausea a few weeks ago while working outside.  He though that maybe his blood sugar was low, as the symptoms resolved after he ate something.  He also notes that it was particularly hot that day and that he does not drink much water.  Mr. [name] denies anginal episodes like what he described at our last visit.  He also denies palpitations, orthopnea, PND, and edema.  He is tolerating his current medications well.  --------------------------------------------------------------------------------------------------  Past Medical/Surgical  History: 1. CAD: Cath in 11/02 after he developed exertional chest pain and had an abnormal myoview. This showed 90% proximal LAD stenosis. Patient had BMS to LAD. EF was 55%. LHC (8/11): radial approach, EF 60%<60-70% ostial LAD, proximal LAD stent with 50-60% in-stent restenosis, ostium of D2 is compressed by the LAD stent with 80% ostial D2 stenosis.  - LHC (8/17): 80% in-stent restenosis proximal LAD treated with DES; EF 65%.  2. Traumatic L1 burst fracture s/p surgery. Chronic back pain with disability. 3. Peripheral neuropathy 4. Hyperlipidemia: simvastatin caused severe myalgias. Tolerated pravastatin and Crestor.  5. Syncope: ?Vasovagal, 1 episode.  6. Melanoma: On face, s/p surgery 2016.   Recent CV Pertinent Labs: Lab Results  Component Value Date   CHOL 130 01/24/2016   HDL 42 01/24/2016   LDLCALC 56 01/24/2016   TRIG 162 (H) 01/24/2016   CHOLHDL 3.1 01/24/2016   INR 1.0 01/24/2016   K 4.1 02/04/2016   BUN 8 02/04/2016   CREATININE 1.03 02/04/2016   CREATININE 1.15 01/24/2016    Past medical and surgical history were reviewed and updated in EPIC.  Current Meds  Medication Sig  . aspirin 81 MG tablet Take 81 mg by mouth daily.    . clopidogrel (PLAVIX) 75 MG tablet TAKE 1 TABLET BY MOUTH ONCE DAILY  . CRESTOR 10 MG tablet TAKE ONE TABLET BY MOUTH ONCE DAILY (MAKE AN APPOINTMENT FOR FURTHER REFILLS)  . gabapentin (NEURONTIN) 600 MG tablet Take 600 mg by mouth 2 (two) times daily.   Marland Kitchen GLUCOSAMINE HCL-MSM PO Take 1 tablet by mouth daily.  . isosorbide mononitrate (IMDUR) 60  MG 24 hr tablet TAKE 1 TABLET BY MOUTH ONCE DAILY  . methocarbamol (ROBAXIN) 500 MG tablet Take 500 mg by mouth every 8 (eight) hours as needed for muscle spasms.   . metoprolol tartrate (LOPRESSOR) 25 MG tablet Take 0.5 tablets (12.5 mg total) by mouth 2 (two) times daily.  . Multiple Vitamin (MULTIVITAMIN) tablet Take 1 tablet by mouth daily.    . nitroGLYCERIN (NITROSTAT) 0.4 MG SL tablet  Place 1 tablet(0.4 mg) under the tongue every five minutes for chest pain, After 3 doses if chest pain is not relieved call 911  . oxyCODONE-acetaminophen (PERCOCET) 5-325 MG per tablet Take 1 tablet by mouth every 8 (eight) hours as needed for moderate pain.   . tamsulosin (FLOMAX) 0.4 MG CAPS capsule Take 0.4 mg by mouth daily.    Allergies: Simvastatin and Tuberculin tests  Social History   Tobacco Use  . Smoking status: Never Smoker  . Smokeless tobacco: Never Used  Substance Use Topics  . Alcohol use: No  . Drug use: No    Family History  Problem Relation Age of Onset  . Stroke Mother   . Heart attack Father     Review of Systems: A 12-system review of systems was performed and was negative except as noted in the HPI.  --------------------------------------------------------------------------------------------------  Physical Exam: BP 108/68   Pulse (!) 50   Ht 5\' 8"  (1.727 m)   Wt 180 lb (81.6 kg)   SpO2 97%   BMI 27.37 kg/m   General:  NAD.  Accompanied by his wife. HEENT: No conjunctival pallor or scleral icterus. Moist mucous membranes.  OP clear. Neck: Supple without lymphadenopathy, thyromegaly, JVD, or HJR.  Lungs: Normal work of breathing. Clear to auscultation bilaterally without wheezes or crackles. Heart: Regular rate and rhythm without murmurs, rubs, or gallops. Non-displaced PMI. Abd: Bowel sounds present. Soft, NT/ND without hepatosplenomegaly Ext: No lower extremity edema. Radial, PT, and DP pulses are 2+ bilaterally. Skin: Warm and dry without rash.  EKG:  Sinus bradycardia (HR 50 bpm).  Otherwise, no significant abnormalities.  Lab Results  Component Value Date   WBC 7.3 01/24/2016   HGB 14.1 01/24/2016   HCT 41.3 01/24/2016   MCV 87.1 01/24/2016   PLT 208 01/24/2016    Lab Results  Component Value Date   NA 139 02/04/2016   K 4.1 02/04/2016   CL 106 02/04/2016   CO2 26 02/04/2016   BUN 8 02/04/2016   CREATININE 1.03 02/04/2016    GLUCOSE 96 02/04/2016    Lab Results  Component Value Date   CHOL 130 01/24/2016   HDL 42 01/24/2016   LDLCALC 56 01/24/2016   TRIG 162 (H) 01/24/2016   CHOLHDL 3.1 01/24/2016    --------------------------------------------------------------------------------------------------  ASSESSMENT AND PLAN: CAD with stable angina Recent episode of atypical chest pain and shortness of breath is not characteristic of coronary insufficiency.  We discussed non-invasive ischemia testing but have agreed to defer this.  We will plan to continue indefinite DAPT with ASA and clopidogrel, as well as antianginal therapy with isosorbide mononitrate.  I will discontinue metoprolol due to significant resting bradycardia and recent episode of dizziness.  I encouraged him to stay well-hydrated with water and to limit his caffeine/soda intake.  Hyperlipidemia Most recent LDL checked by PCP in 05/2017 as at goal.  We will request the results of any more recent labs and continue current dose of rosuvastatin 10 mg daily to maintain LDL < 70.  Follow-up: Return to clinic in  3 months.  Nelva Bush, MD 01/11/2018 9:38 AM

## 2018-01-12 ENCOUNTER — Encounter: Payer: Self-pay | Admitting: Internal Medicine

## 2018-01-12 DIAGNOSIS — I10 Essential (primary) hypertension: Secondary | ICD-10-CM | POA: Insufficient documentation

## 2018-01-15 NOTE — Telephone Encounter (Signed)
Received Fax

## 2018-01-15 NOTE — Telephone Encounter (Signed)
Left Dr Junius Finner nurse message to fax labs for pt.

## 2018-02-16 ENCOUNTER — Other Ambulatory Visit: Payer: Self-pay | Admitting: Internal Medicine

## 2018-02-18 NOTE — Telephone Encounter (Signed)
Please review to refill.

## 2018-04-17 ENCOUNTER — Encounter: Payer: Self-pay | Admitting: *Deleted

## 2018-04-22 ENCOUNTER — Ambulatory Visit: Payer: Medicare Other | Admitting: Internal Medicine

## 2018-05-06 ENCOUNTER — Ambulatory Visit: Payer: Medicare Other | Admitting: Internal Medicine

## 2018-05-07 ENCOUNTER — Ambulatory Visit: Payer: Medicare Other | Admitting: Cardiovascular Disease

## 2018-05-23 ENCOUNTER — Ambulatory Visit: Payer: Medicare Other | Admitting: Cardiovascular Disease

## 2018-06-10 ENCOUNTER — Ambulatory Visit: Payer: Medicare Other | Admitting: Cardiovascular Disease

## 2018-07-08 ENCOUNTER — Ambulatory Visit (INDEPENDENT_AMBULATORY_CARE_PROVIDER_SITE_OTHER): Payer: Medicare Other | Admitting: Cardiovascular Disease

## 2018-07-08 ENCOUNTER — Encounter (INDEPENDENT_AMBULATORY_CARE_PROVIDER_SITE_OTHER): Payer: Self-pay

## 2018-07-08 ENCOUNTER — Encounter: Payer: Self-pay | Admitting: Cardiovascular Disease

## 2018-07-08 VITALS — BP 144/84 | HR 81 | Ht 68.0 in | Wt 188.0 lb

## 2018-07-08 DIAGNOSIS — I25118 Atherosclerotic heart disease of native coronary artery with other forms of angina pectoris: Secondary | ICD-10-CM | POA: Diagnosis not present

## 2018-07-08 DIAGNOSIS — I10 Essential (primary) hypertension: Secondary | ICD-10-CM | POA: Diagnosis not present

## 2018-07-08 MED ORDER — CARVEDILOL 6.25 MG PO TABS: 6.2500 mg | ORAL_TABLET | Freq: Two times a day (BID) | ORAL | 11 refills | Status: DC

## 2018-07-08 NOTE — Progress Notes (Signed)
Follow-up Outpatient Visit Date: 07/08/2018  Primary Care Provider: [name], [address] [place] [address]  Chief Complaint: Follow-up coronary artery disease   Previous notes from Dr. Saunders Revel from January 12, 2019:  HPI:  Marc Montes is a 68 y.o. year-old male with history of coronary artery disease status post PCI to the proximal LAD (BMS in 2002 and DES for ISR in 2017), hyperlipidemia, peripheral neuropathy, and burst fracture of L1 with chronic pain, who presents for follow-up of CAD.  I last saw him in January at which time he reported a single episode of chest pain few months before visit.  It occurred while he was cutting wood and resolved promptly after a single dose of sublingual nitroglycerin.  We agreed to defer additional treatment antianginal regimen consisting of metoprolol and isosorbide mononitrate.  Today, Marc Montes reports feeling well.  He notes a single episode of shortness of breath and his chest "feeling funny" that awoke him in the early morning hours of July 4th.  After feeling uncomfortably for several minutes, he drank something cold with prompt resolution of the pain.  He felt as though something was affecting his esophagus, as he has intermittently had acid reflux in the past that seems to be worse when he lies on his right side.  He has not had any further symptoms.  He denies exertional chest pain and shortness of breath.  He had one episode of dizziness and nausea a few weeks ago while working outside.  He though that maybe his blood sugar was low, as the symptoms resolved after he ate something.  He also notes that it was particularly hot that day and that he does not drink much water.  Marc Montes denies anginal episodes like what he described at our last visit.  He also denies palpitations, orthopnea, PND, and edema.  He is tolerating his current medications well.  [name] 6, 2020  Marc Montes is seen with wife , Neoma Laming, S/p PCI in 2017. Has had  return of his angina.   Has CP with any exertion  The chest pain is very similar to his previous episodes of angina. 2 and stopped metoprolol during his last visit because of lethargy.  Landry  thinks that the chest pain has worsened since that time.     --------------------------------------------------------------------------------------------------  Past Medical/Surgical History: 1. CAD: Cath in 11/02 after he developed exertional chest pain and had an abnormal myoview. This showed 90% proximal LAD stenosis. Patient had BMS to LAD. EF was 55%. LHC (8/11): radial approach, EF 60%<60-70% ostial LAD, proximal LAD stent with 50-60% in-stent restenosis, ostium of D2 is compressed by the LAD stent with 80% ostial D2 stenosis.  - LHC (8/17): 80% in-stent restenosis proximal LAD treated with DES; EF 65%.  2. Traumatic L1 burst fracture s/p surgery. Chronic back pain with disability. 3. Peripheral neuropathy 4. Hyperlipidemia: simvastatin caused severe myalgias. Tolerated pravastatin and Crestor.  5. Syncope: ?Vasovagal, 1 episode.  6. Melanoma: On face, s/p surgery 2016.    Recent CV Pertinent Labs: Lab Results  Component Value Date   CHOL 130 01/24/2016   HDL 42 01/24/2016   LDLCALC 56 01/24/2016   TRIG 162 (H) 01/24/2016   CHOLHDL 3.1 01/24/2016   INR 1.0 01/24/2016   K 4.1 02/04/2016   BUN 8 02/04/2016   CREATININE 1.03 02/04/2016   CREATININE 1.15 01/24/2016    Past medical and surgical history were reviewed and updated in EPIC.  Current Meds  Medication Sig  .  aspirin 81 MG tablet Take 81 mg by mouth daily.    . clopidogrel (PLAVIX) 75 MG tablet TAKE 1 TABLET BY MOUTH ONCE DAILY  . CRESTOR 10 MG tablet TAKE ONE TABLET BY MOUTH ONCE DAILY (MAKE AN APPOINTMENT FOR FURTHER REFILLS)  . gabapentin (NEURONTIN) 600 MG tablet Take 600 mg by mouth 2 (two) times daily.   Marland Kitchen GLUCOSAMINE HCL-MSM PO Take 1 tablet by mouth daily.  . isosorbide mononitrate (IMDUR) 60 MG 24 hr tablet  TAKE 1 TABLET BY MOUTH ONCE DAILY  . methocarbamol (ROBAXIN) 500 MG tablet Take 500 mg by mouth every 8 (eight) hours as needed for muscle spasms.   . Multiple Vitamin (MULTIVITAMIN) tablet Take 1 tablet by mouth daily.    . nitroGLYCERIN (NITROSTAT) 0.4 MG SL tablet Place 1 tablet(0.4 mg) under the tongue every five minutes for chest pain, After 3 doses if chest pain is not relieved call 911  . oxyCODONE-acetaminophen (PERCOCET) 5-325 MG per tablet Take 1 tablet by mouth every 8 (eight) hours as needed for moderate pain.   . tamsulosin (FLOMAX) 0.4 MG CAPS capsule Take 0.4 mg by mouth daily.    Allergies: Simvastatin and Tuberculin tests  Social History   Tobacco Use  . Smoking status: Never Smoker  . Smokeless tobacco: Never Used  Substance Use Topics  . Alcohol use: No  . Drug use: No    Family History  Problem Relation Age of Onset  . Stroke Mother   . Heart attack Father     Review of Systems: A 12-system review of systems was performed and was negative except as noted in the HPI.  --------------------------------------------------------------------------------------------------  Physical Exam: Blood pressure (!) 144/84, pulse 81, height 5\' 8"  (1.727 m), weight 188 lb (85.3 kg), SpO2 94 %.  GEN:  Well nourished, well developed in no acute distress HEENT: Normal NECK: No JVD; No carotid bruits LYMPHATICS: No lymphadenopathy CARDIAC: RRR , no murmurs, rubs, gallops RESPIRATORY:  Clear to auscultation without rales, wheezing or rhonchi  ABDOMEN: Soft, non-tender, non-distended MUSCULOSKELETAL:  No edema; No deformity  SKIN: Warm and dry NEUROLOGIC:  Alert and oriented x 3   EKG:     Lab Results  Component Value Date   WBC 7.3 01/24/2016   HGB 14.1 01/24/2016   HCT 41.3 01/24/2016   MCV 87.1 01/24/2016   PLT 208 01/24/2016    Lab Results  Component Value Date   NA 139 02/04/2016   K 4.1 02/04/2016   CL 106 02/04/2016   CO2 26 02/04/2016   BUN 8  02/04/2016   CREATININE 1.03 02/04/2016   GLUCOSE 96 02/04/2016    Lab Results  Component Value Date   CHOL 130 01/24/2016   HDL 42 01/24/2016   LDLCALC 56 01/24/2016   TRIG 162 (H) 01/24/2016   CHOLHDL 3.1 01/24/2016    --------------------------------------------------------------------------------------------------  ASSESSMENT AND PLAN: 1.  CAD: Clair Gulling is been having some episodes of chest discomfort since stopping the metoprolol. He had lots of fatigue and lethargy with the metoprolol.  We will try carvedilol 6.25 mg twice a day. I will reassess him in 3 months.  If he still having episodes of angina, we will need to refer him for repeat heart catheterization.   Hyperlipidemia: We will get his labs from his primary medical doctor.  He drinks 3 regular Dr. Samson Frederic every day.   Ive advised him to greatly cut back on his soda intake  Continue diet and exercise program

## 2018-07-08 NOTE — Patient Instructions (Addendum)
Medication Instructions:  Your physician has recommended you make the following change in your medication:   START Carvedilol (Coreg) 6.25 mg twice daily  If you need a refill on your cardiac medications before your next appointment, please call your pharmacy.    Lab work: None Ordered    Testing/Procedures: None Ordered   Follow-Up: At [facility], you and your health needs are our priority.  As part of our continuing mission to provide you with exceptional heart care, we have created designated Provider Care Teams.  These Care Teams include your primary Cardiologist (physician) and Advanced Practice Providers (APPs -  Physician Assistants and Nurse Practitioners) who all work together to provide you with the care you need, when you need it. You will need a follow up appointment in:  3 months.  You may see Mertie Moores, MD or one of the following Advanced Practice Providers on your designated Care Team: Richardson Dopp, PA-C [place], [place] . Daune Perch, NP

## 2018-09-17 ENCOUNTER — Other Ambulatory Visit: Payer: Self-pay | Admitting: Internal Medicine

## 2018-09-17 NOTE — Telephone Encounter (Signed)
Refill Request.  

## 2018-10-02 ENCOUNTER — Telehealth: Payer: Self-pay | Admitting: Nurse Practitioner

## 2018-10-02 NOTE — Telephone Encounter (Signed)
Spoke with patient who request that MyChart information be reviewed by his wife. He asked me to call her cell phone to confirm that she can get this set up for the patient's virtual office visit on 4/13. I left a message for patient's wife, Neoma Laming, and advised I will call back tomorrow to confirm.   YOUR CARDIOLOGY TEAM HAS ARRANGED FOR AN E-VISIT FOR YOUR APPOINTMENT - PLEASE REVIEW IMPORTANT INFORMATION BELOW SEVERAL DAYS PRIOR TO YOUR APPOINTMENT  Due to the recent COVID-19 pandemic, we are transitioning in-person office visits to tele-medicine visits in an effort to decrease unnecessary exposure to our patients and staff. Medicare and most insurances are covering these visits without a copay needed. We also encourage you to sign up for MyChart if you have not already done so. You will need a smartphone if possible. For patients that do not have this, we can still complete the visit using a regular telephone but do prefer a smartphone to enable video when possible. You may have a close family member that lives with you that can help. If possible, we also ask that you have a blood pressure cuff and scale at home to measure your blood pressure, heart rate and weight prior to your scheduled appointment. Patients with clinical needs that need an in-person evaluation and testing will still be able to come to the office if absolutely necessary. If you have any questions, feel free to call our office.    IF YOU HAVE A SMARTPHONE, PLEASE DOWNLOAD THE WEBEX APP TO YOUR SMARTPHONE  - If Apple, go to [employer] and type in WebEx in the search bar. [place], the blue/green circle. The app is free but as with any other app download, your phone may require you to verify saved payment information or Apple password. You do NOT have to create a WebEx account.  - If Android, go to [employer] and type in [employer] in the search bar. [place] [employer], the blue/green circle. The app is  free but as with any other app download, your phone may require you to verify saved payment information or Android password. You do NOT have to create a WebEx account.  It is very helpful to have this downloaded before your visit.    2-3 DAYS BEFORE YOUR APPOINTMENT  You will receive a telephone call from one of our [facility] team members - your caller ID may say "Unknown caller." If this is a video visit, we will confirm that you have been able to download the WebEx app. We will remind you check your blood pressure, heart rate and weight prior to your scheduled appointment. If you have an Apple Watch or Kardia, please upload any pertinent ECG strips the day before or morning of your appointment to [place]. Our staff will also make sure you have reviewed the consent and agree to move forward with your scheduled tele-health visit.     THE DAY OF YOUR APPOINTMENT  Approximately 15 minutes prior to your scheduled appointment, you will receive a telephone call from one of [facility] team - your caller ID may say "Unknown caller."  Our staff will confirm medications, vital signs for the day and any symptoms you may be experiencing. Please have this information available prior to the time of visit start. It may also be helpful for you to have a pad of paper and pen handy for any instructions given during your visit. They will also walk you through joining the WebEx  smartphone meeting if this is a video visit.    CONSENT FOR TELE-HEALTH VISIT - PLEASE REVIEW  I hereby voluntarily request, consent and authorize [facility] and its employed or contracted physicians, physician assistants, nurse practitioners or other licensed health care professionals (the Practitioner), to provide me with telemedicine health care services (the "Services") as deemed necessary by the treating Practitioner. I acknowledge and consent to receive the Services by the Practitioner via telemedicine. I understand that the  telemedicine visit will involve communicating with the Practitioner through live audiovisual communication technology and the disclosure of certain medical information by electronic transmission. I acknowledge that I have been given the opportunity to request an in-person assessment or other available alternative prior to the telemedicine visit and am voluntarily participating in the telemedicine visit.  I understand that I have the right to withhold or withdraw my consent to the use of telemedicine in the course of my care at any time, without affecting my right to future care or treatment, and that the Practitioner or I may terminate the telemedicine visit at any time. I understand that I have the right to inspect all information obtained and/or recorded in the course of the telemedicine visit and may receive copies of available information for a reasonable fee.  I understand that some of the potential risks of receiving the Services via telemedicine include:  Marland Kitchen Delay or interruption in medical evaluation due to technological equipment failure or disruption; . Information transmitted may not be sufficient (e.g. poor resolution of images) to allow for appropriate medical decision making by the Practitioner; and/or  . In rare instances, security protocols could fail, causing a breach of personal health information.  Furthermore, I acknowledge that it is my responsibility to provide information about my medical history, conditions and care that is complete and accurate to the best of my ability. I acknowledge that Practitioner's advice, recommendations, and/or decision may be based on factors not within their control, such as incomplete or inaccurate data provided by me or distortions of diagnostic images or specimens that may result from electronic transmissions. I understand that the practice of medicine is not an exact science and that Practitioner makes no warranties or guarantees regarding treatment  outcomes. I acknowledge that I will receive a copy of this consent concurrently upon execution via email to the email address I last provided but may also request a printed copy by calling the office of [facility].    I understand that my insurance will be billed for this visit.   I have read or had this consent read to me. . I understand the contents of this consent, which adequately explains the benefits and risks of the Services being provided via telemedicine.  . I have been provided ample opportunity to ask questions regarding this consent and the Services and have had my questions answered to my satisfaction. . I give my informed consent for the services to be provided through the use of telemedicine in my medical care  By participating in this telemedicine visit I agree to the above.

## 2018-10-03 NOTE — Telephone Encounter (Signed)
Confirmed appointment with patient's wife. I advised her to call back with questions or concerns prior to April 13

## 2018-10-07 ENCOUNTER — Telehealth: Payer: Self-pay | Admitting: Cardiovascular Disease

## 2018-10-07 NOTE — Telephone Encounter (Signed)
Spoke with patient on the phone and patient's wife via [place].  Spouse was able to get into MyChart with no problem. Confirmed all demographics and e-mail.

## 2018-10-13 NOTE — Progress Notes (Signed)
Virtual Visit via Video Note   This visit type was conducted due to national recommendations for restrictions regarding the COVID-19 Pandemic (e.g. social distancing) in an effort to limit this patient's exposure and mitigate transmission in our community.  Due to his co-morbid illnesses, this patient is at least at moderate risk for complications without adequate follow up.  This format is felt to be most appropriate for this patient at this time.  All issues noted in this document were discussed and addressed.  A limited physical exam was performed with this format.  Please refer to the patient's chart for his consent to telehealth for [facility].   Evaluation Performed:  Follow-up visit  Date:  10/14/2018   ID:  Marc Montes, Marc Montes Jul 08, 1950, MRN [id number]  Patient Location: Home  Provider Location: Office  PCP:  [name], DO  Cardiologist:  Mertie Moores, MD  Electrophysiologist:  None   Chief Complaint:  CAD   History of Present Illness:    Marc Montes is a 68 y.o. male who presents via audio/video conferencing for a telehealth visit today.    .  The patient is a 68 year old gentleman who is referred from her in.  He has a history of coronary artery disease and is status post PCI to the proximal LAD in 2017.  He also has a history of hyperlipidemia, process peripheral neuropathy.  He was seen in January, 2020.  He had been having some increasing episodes of chest pain which seems to worsened.  We started him on Coreg 6.25 BID and have scheduled him to return today for follow up   Has felt much better.  Able to work and work out with out angina  No side effects with the coreg.   Has not had to take any NTG .    The patient does not have symptoms concerning for COVID-19 infection (fever, chills, cough, or new shortness of breath).    Past Medical History:  Diagnosis Date  . Basal cell carcinoma of face    "burned off"  . CAD (coronary artery  disease)    a. PCI to LAD in 2002 b. 02/2016: Cath w/ 85% stenosis mid-LAD, DES placed.  . Chronic lower back pain   . Exposure to TB   . GERD (gastroesophageal reflux disease)   . History of hiatal hernia   . Hyperlipidemia    simvastatin caused severe myalgias. Tolerated pravastatin.   . 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 Peripheral neuropathy   . Refusal of blood transfusions as patient is Jehovah's Witness    Past Surgical History:  Procedure Laterality Date  . CARDIAC CATHETERIZATION  01/2010  . CARDIAC CATHETERIZATION N/A 02/03/2016   Procedure: Left Heart Cath and Coronary Angiography;  Surgeon: Larey Dresser, MD;  Location: [place] [facility];  Service: Cardiovascular;  Laterality: N/A;  . CARDIAC CATHETERIZATION N/A 02/03/2016   Procedure: Intravascular Pressure Wire/FFR Study;  Surgeon: Nelva Bush, MD;  Location: [place] [facility];  Service: Cardiovascular;  Laterality: N/A;  . CARDIAC CATHETERIZATION N/A 02/03/2016   Procedure: Coronary Stent Intervention;  Surgeon: Nelva Bush, MD;  Location: [place] [facility];  Service: Cardiovascular;  Laterality: N/A;  . CARDIAC CATHETERIZATION N/A 02/03/2016   Procedure: Intravascular Ultrasound/IVUS;  Surgeon: Nelva Bush, MD;  Location: [place] [facility];  Service: Cardiovascular;  Laterality: N/A;  . CORONARY ANGIOPLASTY WITH STENT PLACEMENT  05/2001; 02/03/2016   "1 stent; 1 stent"  .  MELANOMA EXCISION  2016   upper lip  . POSTERIOR LUMBAR FUSION  10/2001   Thoracolumbar decompressive laminectomy with bilateral L1 transpedicular decompressions and open reduction of L1 fracture. Posterolateral fusion from T11 to L3 using segmental pedicle screw instrumentation/notes 11/15/2010  . TONSILLECTOMY       Current Meds  Medication Sig  . aspirin 81 MG tablet Take 81 mg by mouth daily.    . carvedilol (COREG) 6.25 MG tablet Take 1 tablet (6.25 mg total) by mouth 2 (two) times daily.  . clopidogrel (PLAVIX) 75 MG tablet  TAKE 1 TABLET BY MOUTH ONCE DAILY  . CRESTOR 10 MG tablet TAKE ONE TABLET BY MOUTH ONCE DAILY (MAKE AN APPOINTMENT FOR FURTHER REFILLS)  . gabapentin (NEURONTIN) 600 MG tablet Take 600 mg by mouth 2 (two) times daily.   Marland Kitchen GLUCOSAMINE HCL-MSM PO Take 1 tablet by mouth daily.  . isosorbide mononitrate (IMDUR) 60 MG 24 hr tablet Take 1 tablet by mouth once daily  . methocarbamol (ROBAXIN) 500 MG tablet Take 500 mg by mouth every 8 (eight) hours as needed for muscle spasms.   . Multiple Vitamin (MULTIVITAMIN) tablet Take 1 tablet by mouth daily.    . nitroGLYCERIN (NITROSTAT) 0.4 MG SL tablet Place 1 tablet(0.4 mg) under the tongue every five minutes for chest pain, After 3 doses if chest pain is not relieved call 911  . oxyCODONE-acetaminophen (PERCOCET) 5-325 MG per tablet Take 1 tablet by mouth every 8 (eight) hours as needed for moderate pain.   . tamsulosin (FLOMAX) 0.4 MG CAPS capsule Take 0.4 mg by mouth daily.     Allergies:   Simvastatin and Tuberculin tests   Social History   Tobacco Use  . Smoking status: Never Smoker  . Smokeless tobacco: Never Used  Substance Use Topics  . Alcohol use: No  . Drug use: No     Family Hx: The patient's family history includes Heart attack in his father; Stroke in his mother.  ROS:   Please see the history of present illness.     All other systems reviewed and are negative.   Prior CV studies:   The following studies were reviewed today:    Labs/Other Tests and Data Reviewed:    EKG:  No ECG reviewed.  Recent Labs: No results found for requested labs within last 8760 hours.   Recent Lipid Panel Lab Results  Component Value Date/Time   CHOL 130 01/24/2016 04:38 PM   TRIG 162 (H) 01/24/2016 04:38 PM   HDL 42 01/24/2016 04:38 PM   CHOLHDL 3.1 01/24/2016 04:38 PM   LDLCALC 56 01/24/2016 04:38 PM    Wt Readings from Last 3 Encounters:  10/14/18 183 lb 12.8 oz (83.4 kg)  07/08/18 188 lb (85.3 kg)  01/11/18 180 lb (81.6 kg)      Objective:    Vital Signs:  BP 140/88 (BP Location: Left Arm, Patient Position: Sitting, Cuff Size: Normal)   Pulse (!) 58   Ht 5\' 8"  (1.727 m)   Wt 183 lb 12.8 oz (83.4 kg)   BMI 27.95 kg/m    General:   Appears healthy,   NAD HEENT:   No obvious JVD or lymphadenopathy Resp:   Normal work of breathing,   resp rate is normal  CV :   BP and HR are normal ,  No edema Abd:   No abdomina swelling , Ext:   No clubbing, cyanosis, or edema  Neuro:   Alert and oriented x 3.  No obvious motor deficits Skin : no obvious rashes    ASSESSMENT & PLAN:    1. CAD:  His angina is under good control on current medications.  Continue coreg.  Imdur   2.  HTN:  BP is a bit elevated today .  Admits to eating a bit more salt recently Advised him to avoid eating any extra salt.   Would consider adding a diuretic or perhaps ARB in several weeks if he needs additional BP meds.   He will measure his BP daily and record in a BP log. Will send in to [place] via Mychart in 2 weeks   3.   Hyperlipidemia:   Stable      COVID-19 Education: The signs and symptoms of COVID-19 were discussed with the patient and how to seek care for testing (follow up with PCP or arrange E-visit).  The importance of social distancing was discussed today.  Time:   Today, I have spent  15  minutes with the patient with telehealth technology discussing the above problems.     Medication Adjustments/Labs and Tests Ordered: Current medicines are reviewed at length with the patient today.  Concerns regarding medicines are outlined above.  Tests Ordered: No orders of the defined types were placed in this encounter.  Medication Changes: No orders of the defined types were placed in this encounter.   Disposition:  Follow up in 4 month(s)  Signed, Mertie Moores, MD  10/14/2018 11:44 AM    [facility]

## 2018-10-14 ENCOUNTER — Encounter: Payer: Self-pay | Admitting: Cardiovascular Disease

## 2018-10-14 ENCOUNTER — Other Ambulatory Visit: Payer: Self-pay

## 2018-10-14 ENCOUNTER — Ambulatory Visit: Payer: Medicare Other | Admitting: Cardiovascular Disease

## 2018-10-14 ENCOUNTER — Telehealth (INDEPENDENT_AMBULATORY_CARE_PROVIDER_SITE_OTHER): Payer: Medicare Other | Admitting: Cardiovascular Disease

## 2018-10-14 DIAGNOSIS — I209 Angina pectoris, unspecified: Secondary | ICD-10-CM

## 2018-10-14 NOTE — Patient Instructions (Addendum)
Medication Instructions:  Your physician recommends that you continue on your current medications as directed. Please refer to the Current Medication list given to you today.  If you need a refill on your cardiac medications before your next appointment, please call your pharmacy.   Lab work: None Ordered   Testing/Procedures: None Ordered   Follow-Up: HOW TO TAKE YOUR BLOOD PRESSURE:  Rest 5 minutes before taking your blood pressure.   Don't smoke or drink caffeinated beverages for at least 30 minutes before.  Take your blood pressure before (not after) you eat.  Sit comfortably with your back supported and both feet on the floor (don't cross your legs).  Elevate your arm to heart level on a table or a desk.  Use the proper sized cuff. It should fit smoothly and snugly around your bare upper arm. There should be enough room to slip a fingertip under the cuff. The bottom edge of the cuff should be 1 inch above the crease of the elbow.   At [facility], you and your health needs are our priority.  As part of our continuing mission to provide you with exceptional heart care, we have created designated Provider Care Teams.  These Care Teams include your primary Cardiologist (physician) and Advanced Practice Providers (APPs -  Physician Assistants and Nurse Practitioners) who all work together to provide you with the care you need, when you need it. You should return for an appointment in:  4 months on Wed. August 19 at 10:20 am with Mertie Moores, MD

## 2019-02-07 ENCOUNTER — Other Ambulatory Visit: Payer: Self-pay | Admitting: Internal Medicine

## 2019-02-07 NOTE — Telephone Encounter (Signed)
Refill Request.  

## 2019-02-19 ENCOUNTER — Ambulatory Visit: Payer: Medicare Other | Admitting: Cardiovascular Disease

## 2019-02-19 ENCOUNTER — Other Ambulatory Visit: Payer: Self-pay

## 2019-02-19 ENCOUNTER — Ambulatory Visit (INDEPENDENT_AMBULATORY_CARE_PROVIDER_SITE_OTHER): Payer: Medicare Other | Admitting: Cardiovascular Disease

## 2019-02-19 ENCOUNTER — Encounter: Payer: Self-pay | Admitting: Cardiovascular Disease

## 2019-02-19 VITALS — BP 124/80 | HR 60 | Ht 68.0 in | Wt 183.4 lb

## 2019-02-19 DIAGNOSIS — I25118 Atherosclerotic heart disease of native coronary artery with other forms of angina pectoris: Secondary | ICD-10-CM

## 2019-02-19 DIAGNOSIS — I209 Angina pectoris, unspecified: Secondary | ICD-10-CM

## 2019-02-19 DIAGNOSIS — E785 Hyperlipidemia, unspecified: Secondary | ICD-10-CM | POA: Diagnosis not present

## 2019-02-19 DIAGNOSIS — I10 Essential (primary) hypertension: Secondary | ICD-10-CM | POA: Diagnosis not present

## 2019-02-19 MED ORDER — CARVEDILOL 6.25 MG PO TABS: 6.2500 mg | ORAL_TABLET | Freq: Two times a day (BID) | ORAL | 3 refills | Status: DC

## 2019-02-19 MED ORDER — NITROGLYCERIN 0.4 MG SL SUBL: SUBLINGUAL_TABLET | SUBLINGUAL | 3 refills | Status: DC

## 2019-02-19 MED ORDER — ISOSORBIDE MONONITRATE ER 60 MG PO TB24: 60.0000 mg | ORAL_TABLET | Freq: Every day | ORAL | 3 refills | Status: DC

## 2019-02-19 MED ORDER — CLOPIDOGREL BISULFATE 75 MG PO TABS: 75.0000 mg | ORAL_TABLET | Freq: Every day | ORAL | 3 refills | Status: DC

## 2019-02-19 NOTE — Patient Instructions (Signed)
Medication Instructions:  Your physician recommends that you continue on your current medications as directed. Please refer to the Current Medication list given to you today.  If you need a refill on your cardiac medications before your next appointment, please call your pharmacy.   Lab work: None ordered  If you have labs (blood work) drawn today and your tests are completely normal, you will receive your results only by: . MyChart Message (if you have MyChart) OR . A paper copy in the mail If you have any lab test that is abnormal or we need to change your treatment, we will call you to review the results.  Testing/Procedures: None ordered   Follow-Up: At [facility], you and your health needs are our priority.  As part of our continuing mission to provide you with exceptional heart care, we have created designated Provider Care Teams.  These Care Teams include your primary Cardiologist (physician) and Advanced Practice Providers (APPs -  Physician Assistants and Nurse Practitioners) who all work together to provide you with the care you need, when you need it. You will need a follow up appointment in:  12 months.  Please call our office 2 months in advance to schedule this appointment.  You may see Philip Nahser, MD or one of the following Advanced Practice Providers on your designated Care Team: Scott Weaver, PA-C Vin Bhagat, PA-C . Janine Hammond, NP  Any Other Special Instructions Will Be Listed Below (If Applicable).    

## 2019-02-19 NOTE — Progress Notes (Signed)
Follow-up Outpatient Visit Date: 02/19/2019  Primary Care Provider: [name], Marc Montes [id number]  Chief Complaint: Follow-up coronary artery disease   Previous notes from Dr. Saunders Revel from January 12, 2019:  HPI:  [name] is a 68 y.o. year-old male with history of coronary artery disease status post PCI to the proximal LAD (BMS in 2002 and DES for ISR in 2017), hyperlipidemia, peripheral neuropathy, and burst fracture of L1 with chronic pain, who presents for follow-up of CAD.  I last saw him in January at which time he reported a single episode of chest pain few months before visit.  It occurred while he was cutting wood and resolved promptly after a single dose of sublingual nitroglycerin.  We agreed to defer additional treatment antianginal regimen consisting of metoprolol and isosorbide mononitrate.  Today, [name] reports feeling well.  He notes a single episode of shortness of breath and his chest "feeling funny" that awoke him in the early morning hours of July 4th.  After feeling uncomfortably for several minutes, he drank something cold with prompt resolution of the pain.  He felt as though something was affecting his esophagus, as he has intermittently had acid reflux in the past that seems to be worse when he lies on his right side.  He has not had any further symptoms.  He denies exertional chest pain and shortness of breath.  He had one episode of dizziness and nausea a few weeks ago while working outside.  He though that maybe his blood sugar was low, as the symptoms resolved after he ate something.  He also notes that it was particularly hot that day and that he does not drink much water.  Mr. Abello denies anginal episodes like what he described at our last visit.  He also denies palpitations, orthopnea, PND, and edema.  He is tolerating his current medications well.  [name] 6, 2020  [place] is seen with wife , Neoma Laming, S/p PCI in 2017. Has  had return of his angina.   Has CP with any exertion  The chest pain is very similar to his previous episodes of angina. 2 and stopped metoprolol during his last visit because of lethargy.  Adonte  thinks that the chest pain has worsened since that time.  Aug. 19, 2020   [name] is seen today for follow up visit . Hx of CAD  Has some cp if he does anything strenous .  Still walks  Is rebuilding a 1963 Chevy Suburban   We started Coreg at his last visit  Seems to be tolerating it well     --------------------------------------------------------------------------------------------------  Past Medical/Surgical History: 1. CAD: Cath in 11/02 after he developed exertional chest pain and had an abnormal myoview. This showed 90% proximal LAD stenosis. Patient had BMS to LAD. EF was 55%. LHC (8/11): radial approach, EF 60%<60-70% ostial LAD, proximal LAD stent with 50-60% in-stent restenosis, ostium of D2 is compressed by the LAD stent with 80% ostial D2 stenosis.  - LHC (8/17): 80% in-stent restenosis proximal LAD treated with DES; EF 65%.  2. Traumatic L1 burst fracture s/p surgery. Chronic back pain with disability. 3. Peripheral neuropathy 4. Hyperlipidemia: simvastatin caused severe myalgias. Tolerated pravastatin and Crestor.  5. Syncope: ?Vasovagal, 1 episode.  6. Melanoma: On face, s/p surgery 2016.    Recent CV Pertinent Labs: Lab Results  Component Value Date   CHOL 130 01/24/2016   HDL 42 01/24/2016   LDLCALC 56 01/24/2016  TRIG 162 (H) 01/24/2016   CHOLHDL 3.1 01/24/2016   INR 1.0 01/24/2016   K 4.1 02/04/2016   BUN 8 02/04/2016   CREATININE 1.03 02/04/2016   CREATININE 1.15 01/24/2016    Past medical and surgical history were reviewed and updated in EPIC.  No outpatient medications have been marked as taking for the 02/19/19 encounter (Office Visit) with , Marc Cheng, MD.    Allergies: Simvastatin and Tuberculin tests  Social History   Tobacco Use   . Smoking status: Never Smoker  . Smokeless tobacco: Never Used  Substance Use Topics  . Alcohol use: No  . Drug use: No    Family History  Problem Relation Age of Onset  . Stroke Mother   . Heart attack Father     Review of Systems: A 12-system review of systems was performed and was negative except as noted in the HPI.  --------------------------------------------------------------------------------------------------   Physical Exam: There were no vitals taken for this visit.  GEN:  Middle age male,  NAD  HEENT: Normal NECK: No JVD; No carotid bruits LYMPHATICS: No lymphadenopathy CARDIAC: RRR , no murmurs, rubs, gallops RESPIRATORY:  Clear to auscultation without rales, wheezing or rhonchi  ABDOMEN: Soft, non-tender, non-distended MUSCULOSKELETAL:  No edema; No deformity  SKIN: Warm and dry NEUROLOGIC:  Alert and oriented x 3  EKG:     Aug. 19, 2020 :   NSR at 60  .  1st degree AV block   Lab Results  Component Value Date   WBC 7.3 01/24/2016   HGB 14.1 01/24/2016   HCT 41.3 01/24/2016   MCV 87.1 01/24/2016   PLT 208 01/24/2016    Lab Results  Component Value Date   NA 139 02/04/2016   K 4.1 02/04/2016   CL 106 02/04/2016   CO2 26 02/04/2016   BUN 8 02/04/2016   CREATININE 1.03 02/04/2016   GLUCOSE 96 02/04/2016    Lab Results  Component Value Date   CHOL 130 01/24/2016   HDL 42 01/24/2016   LDLCALC 56 01/24/2016   TRIG 162 (H) 01/24/2016   CHOLHDL 3.1 01/24/2016    --------------------------------------------------------------------------------------------------  ASSESSMENT AND PLAN:  1.  CAD:   No significant angina.  Cont current meds. Encouraged him to continue to get aerobic exercise    2.  Hyperlipidemia:  - managed by his primary MD    3.  HTN  _ BP is well controlled today after we started coreg several months ago  Still drinks lot of soda ( Dr. Malachi Bonds as many as 3 a day )     Mertie Moores, MD  02/19/2019 10:46 AM    [facility],  [address] [place], [place]  [address] Pager [phone] Phone: [phone]; Fax: [phone]

## 2019-04-18 ENCOUNTER — Ambulatory Visit: Payer: Medicare Other | Admitting: Cardiovascular Disease

## 2019-11-04 ENCOUNTER — Other Ambulatory Visit: Payer: Self-pay | Admitting: Cardiology

## 2020-02-24 ENCOUNTER — Other Ambulatory Visit: Payer: Self-pay | Admitting: Cardiovascular Disease

## 2020-03-05 ENCOUNTER — Ambulatory Visit: Payer: Medicare Other | Admitting: Cardiovascular Disease

## 2020-03-10 ENCOUNTER — Other Ambulatory Visit: Payer: Self-pay | Admitting: Cardiovascular Disease

## 2020-03-11 NOTE — Telephone Encounter (Signed)
Outpatient Medication Detail   Disp Refills Start End   carvedilol (COREG) 6.25 MG tablet 180 tablet 0 02/25/2020    Sig: Take 1 tablet by mouth twice daily   Sent to pharmacy as: carvedilol (COREG) 6.25 MG tablet   E-Prescribing Status: Receipt confirmed by pharmacy (02/25/2020  8:23 AM EDT)   Pharmacy  [facility], [place].

## 2020-03-12 ENCOUNTER — Other Ambulatory Visit: Payer: Self-pay

## 2020-03-12 ENCOUNTER — Encounter: Payer: Self-pay | Admitting: Cardiovascular Disease

## 2020-03-12 ENCOUNTER — Ambulatory Visit (INDEPENDENT_AMBULATORY_CARE_PROVIDER_SITE_OTHER): Payer: Medicare Other | Admitting: Cardiovascular Disease

## 2020-03-12 ENCOUNTER — Telehealth: Payer: Self-pay | Admitting: Cardiovascular Disease

## 2020-03-12 VITALS — BP 112/76 | HR 58 | Ht 68.0 in | Wt 187.2 lb

## 2020-03-12 DIAGNOSIS — E785 Hyperlipidemia, unspecified: Secondary | ICD-10-CM | POA: Diagnosis not present

## 2020-03-12 DIAGNOSIS — I25118 Atherosclerotic heart disease of native coronary artery with other forms of angina pectoris: Secondary | ICD-10-CM | POA: Diagnosis not present

## 2020-03-12 NOTE — Patient Instructions (Signed)
Medication Instructions:  Your physician recommends that you continue on your current medications as directed. Please refer to the Current Medication list given to you today.  *If you need a refill on your cardiac medications before your next appointment, please call your pharmacy*   Lab Work: None  If you have labs (blood work) drawn today and your tests are completely normal, you will receive your results only by: . MyChart Message (if you have MyChart) OR . A paper copy in the mail If you have any lab test that is abnormal or we need to change your treatment, we will call you to review the results.   Testing/Procedures: None   Follow-Up: At [facility], you and your health needs are our priority.  As part of our continuing mission to provide you with exceptional heart care, we have created designated Provider Care Teams.  These Care Teams include your primary Cardiologist (physician) and Advanced Practice Providers (APPs -  Physician Assistants and Nurse Practitioners) who all work together to provide you with the care you need, when you need it.  We recommend signing up for the patient portal called "MyChart".  Sign up information is provided on this After Visit Summary.  MyChart is used to connect with patients for Virtual Visits (Telemedicine).  Patients are able to view lab/test results, encounter notes, upcoming appointments, etc.  Non-urgent messages can be sent to your provider as well.   To learn more about what you can do with MyChart, go to https://www.mychart.com.    Your next appointment:   12 month(s)  The format for your next appointment:   In Person  Provider:   You may see Philip Nahser, MD or one of the following Advanced Practice Providers on your designated Care Team:    Scott Weaver, PA-C  Vin Bhagat, PA-C    Other Instructions None  
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
## 2020-03-12 NOTE — Telephone Encounter (Signed)
    Pt's wife said she called pt's pcp Dr. Elvina Mattes to get copy of pt's lab results, however, they told her Dr. Acie Fredrickson needs to send a request to Dr. Junius Finner office. The last lab result pt has was in March.

## 2020-03-12 NOTE — Telephone Encounter (Signed)
Called and made the patient aware that I have left a message for Dr. Junius Finner RN to call back so that we can get a copy of his most recent lab results.

## 2020-03-15 NOTE — Telephone Encounter (Signed)
Left another message for Dr. Junius Finner RN to call back so we can get a copy of the patient's most recent LIPID results.

## 2020-03-19 NOTE — Telephone Encounter (Signed)
Left another message for Dr. Junius Finner RN to call back.

## 2020-03-19 NOTE — Telephone Encounter (Signed)
Marc Montes with [facility] is returning call. She states she attempted to submit the fax again.

## 2020-03-24 NOTE — Telephone Encounter (Signed)
Labs received and taken to HIM to be scanned into patient's chart

## 2020-04-05 ENCOUNTER — Telehealth: Payer: Self-pay | Admitting: Cardiovascular Disease

## 2020-04-05 DIAGNOSIS — R55 Syncope and collapse: Secondary | ICD-10-CM

## 2020-04-05 NOTE — Telephone Encounter (Signed)
    Pt's wife calling, she said pt was not feeling good, this weekend pt passed out, unresponsive for 2-3 minutes with his eyes open. EMS came and assist the pt. She said pt thought it might because of his blood sugar down but EMS think it's related to his heart. She said it might have been one of his medication causing this but wasn't sure which one.

## 2020-04-05 NOTE — Telephone Encounter (Signed)
He needs to contact his primary MD also. Lets place a 30 day monitor to rule out arrhythmias.  We had discussed stopping his statin for muscle aches.  I dont think the statin has anything to do with this episode of unresponsiveness.   He should go to the ER if he has additional episodes of unresponsiveness.

## 2020-04-05 NOTE — Telephone Encounter (Signed)
Spoke with patient who states he has been having "this condition" for years where he feels an episode coming on and is able to sit and rest for awhile but has never passed out from one of these events until Saturday. He does not monitor BP and denies recent onset of dizziness or lightheadedness. Denies blood in urine or stool, denies dark tarry stools. He states he will call PCP for follow-up and agrees to plan to wear a 30 day monitor. He has an upcoming cataract surgery on 10/20 and then a one week vacation starting on 10/27. He states he will apply the monitor when he returns from his vacation. I reviewed ER precautions and advised patient to call back with questions or concerns. He verbalized understanding and agreement with plan and thanked me for the call.

## 2020-04-05 NOTE — Telephone Encounter (Signed)
Returned call to wife, husband answered and said to speak to wife. Pt had episode on Saturday when he was with his dtr-in-law, who is an Therapist, sports.  Pt was slumped in chair, unresponsive for several minutes.  Was given juice and he started to come around.  EMS arrived, told family they didn't think it was his sugar, EKG was normal, pt back to normal about 30 min after EMS got there.  They advised them to f/u w/ cardiologist. Pt denies any precipitating factors leading up to episode. Reports EMS VS:  BP 112/82, HR 62, O2 sat 92%, glucose 161 Pt currently fine, no further episodes/issues.  Denies CP, SOB, weight gain/edema, dizziness.  She also wanted Dr. Acie Fredrickson to know that pt recently had bone density test which showed osteoporosis.  Wife reports that Dr. Acie Fredrickson mentioned maybe stopping one of his medications for awhile to see if it would make a difference in symptoms, and she can't remember the medication he suggested.  Aware I will forward to MD for advisement.

## 2020-04-15 ENCOUNTER — Other Ambulatory Visit: Payer: Self-pay | Admitting: Cardiovascular Disease

## 2020-04-18 ENCOUNTER — Ambulatory Visit (INDEPENDENT_AMBULATORY_CARE_PROVIDER_SITE_OTHER): Payer: Medicare Other

## 2020-04-18 DIAGNOSIS — R55 Syncope and collapse: Secondary | ICD-10-CM

## 2020-05-24 ENCOUNTER — Other Ambulatory Visit: Payer: Self-pay | Admitting: Cardiovascular Disease

## 2020-05-24 NOTE — Telephone Encounter (Signed)
rx refill

## 2020-06-10 ENCOUNTER — Other Ambulatory Visit: Payer: Self-pay | Admitting: Cardiovascular Disease

## 2020-07-05 LAB — COLOGUARD: COLOGUARD: POSITIVE — AB

## 2020-08-31 ENCOUNTER — Other Ambulatory Visit: Payer: Self-pay | Admitting: Cardiovascular Disease

## 2020-09-03 ENCOUNTER — Telehealth: Payer: Self-pay | Admitting: Cardiovascular Disease

## 2020-09-03 NOTE — Telephone Encounter (Signed)
RN spoke with patient regarding a message he received about reviewing medications. Patient states that he did not realize the message was from his pharmacy. RN encouraged pt to contact office with any questions.

## 2020-09-03 NOTE — Telephone Encounter (Signed)
Patient's wife states the patient received a text advising him to review his medications. She would like to know what this was regarding. Please advise.

## 2021-01-10 ENCOUNTER — Other Ambulatory Visit: Payer: Self-pay | Admitting: Cardiovascular Disease

## 2021-02-27 ENCOUNTER — Other Ambulatory Visit: Payer: Self-pay | Admitting: Cardiovascular Disease

## 2021-03-02 ENCOUNTER — Other Ambulatory Visit: Payer: Self-pay | Admitting: Cardiovascular Disease

## 2021-03-14 ENCOUNTER — Encounter: Payer: Self-pay | Admitting: Cardiovascular Disease

## 2021-03-14 ENCOUNTER — Encounter: Payer: Self-pay | Admitting: Nurse Practitioner

## 2021-03-14 ENCOUNTER — Other Ambulatory Visit: Payer: Self-pay

## 2021-03-14 ENCOUNTER — Ambulatory Visit (INDEPENDENT_AMBULATORY_CARE_PROVIDER_SITE_OTHER): Payer: Medicare Other | Admitting: Cardiovascular Disease

## 2021-03-14 VITALS — BP 102/62 | HR 62 | Ht 68.0 in | Wt 182.2 lb

## 2021-03-14 DIAGNOSIS — E785 Hyperlipidemia, unspecified: Secondary | ICD-10-CM | POA: Diagnosis not present

## 2021-03-14 DIAGNOSIS — I200 Unstable angina: Secondary | ICD-10-CM

## 2021-03-14 NOTE — Patient Instructions (Signed)
Medication Instructions:  Your physician recommends that you continue on your current medications as directed. Please refer to the Current Medication list given to you today.  *If you need a refill on your cardiac medications before your next appointment, please call your pharmacy*   Lab Work: Your physician recommends that you return for lab work within the next few weeks or on the same day as your stress test. You will need to FAST for this appointment - nothing to eat or drink after midnight the night before except water.  If you have labs (blood work) drawn today and your tests are completely normal, you will receive your results only by: [place] (if you have MyChart) OR A paper copy in the mail If you have any lab test that is abnormal or we need to change your treatment, we will call you to review the results.   Testing/Procedures: Your physician has requested that you have an exercise stress myoview. For further information please visit [email or web address]. Please follow instruction sheet, as given. **Hold your carvedilol on the morning of your stress test   Follow-Up: At [facility], you and your health needs are our priority.  As part of our continuing mission to provide you with exceptional heart care, we have created designated Provider Care Teams.  These Care Teams include your primary Cardiologist (physician) and Advanced Practice Providers (APPs -  Physician Assistants and Nurse Practitioners) who all work together to provide you with the care you need, when you need it.    Your next appointment:   1 year(s)  The format for your next appointment:   In Person  Provider:   You may see Mertie Moores, MD or one of the following Advanced Practice Providers on your designated Care Team:   Richardson Dopp, PA-C [place], [place]
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
## 2021-03-23 ENCOUNTER — Telehealth (HOSPITAL_COMMUNITY): Payer: Self-pay

## 2021-03-23 NOTE — Telephone Encounter (Signed)
Spoke with the patient, he stated that he was sick yesterday. We rescheduled his appointment for 03/30/21 at 7:45 am. Detailed instructions were given, he stated that he would be here for his test. Asked to call back with any questions. Marc Montes EMTP

## 2021-03-24 ENCOUNTER — Encounter (HOSPITAL_COMMUNITY): Payer: Medicare Other

## 2021-03-24 ENCOUNTER — Other Ambulatory Visit: Payer: Medicare Other

## 2021-03-28 ENCOUNTER — Other Ambulatory Visit: Payer: Medicare Other | Admitting: *Deleted

## 2021-03-28 ENCOUNTER — Other Ambulatory Visit: Payer: Self-pay

## 2021-03-28 ENCOUNTER — Ambulatory Visit (HOSPITAL_COMMUNITY): Payer: Medicare Other | Attending: Cardiology

## 2021-03-28 DIAGNOSIS — E785 Hyperlipidemia, unspecified: Secondary | ICD-10-CM

## 2021-03-28 DIAGNOSIS — I200 Unstable angina: Secondary | ICD-10-CM | POA: Diagnosis present

## 2021-03-28 LAB — BASIC METABOLIC PANEL
BUN/Creatinine Ratio: 11 (ref 10–24)
BUN: 10 mg/dL (ref 8–27)
CO2: 26 mmol/L (ref 20–29)
Calcium: 8.5 mg/dL — ABNORMAL LOW (ref 8.6–10.2)
Chloride: 103 mmol/L (ref 96–106)
Creatinine, Ser: 0.95 mg/dL (ref 0.76–1.27)
Glucose: 113 mg/dL — ABNORMAL HIGH (ref 65–99)
Potassium: 4 mmol/L (ref 3.5–5.2)
Sodium: 142 mmol/L (ref 134–144)
eGFR: 86 mL/min/{1.73_m2} (ref >59)

## 2021-03-28 LAB — LIPID PANEL
Chol/HDL Ratio: 3.2 ratio (ref 0.0–5.0)
Cholesterol, Total: 90 mg/dL — ABNORMAL LOW (ref 100–199)
HDL: 28 mg/dL — ABNORMAL LOW (ref >39)
LDL Chol Calc (NIH): 41 mg/dL (ref 0–99)
Triglycerides: 113 mg/dL (ref 0–149)
VLDL Cholesterol Cal: 21 mg/dL (ref 5–40)

## 2021-03-28 LAB — MYOCARDIAL PERFUSION IMAGING
Base ST Depression (mm): 0 mm
Estimated workload: 7
Exercise duration (min): 7 min
LV dias vol: 62 mL (ref 62–150)
LV sys vol: 24 mL
MPHR: 150 {beats}/min
Nuc Stress EF: 62 %
Peak HR: 129 {beats}/min
Percent HR: 86 %
RPE: 18
Rest HR: 56 {beats}/min
Rest Nuclear Isotope Dose: 9.8 mCi
SDS: 1
SRS: 0
SSS: 1
ST Depression (mm): 0 mm
Stress Nuclear Isotope Dose: 29.6 mCi
TID: 0.84

## 2021-03-28 LAB — HEPATIC FUNCTION PANEL
ALT: 10 IU/L (ref 0–44)
AST: 15 IU/L (ref 0–40)
Albumin: 3.6 g/dL — ABNORMAL LOW (ref 3.7–4.7)
Alkaline Phosphatase: 120 IU/L (ref 44–121)
Bilirubin Total: 0.2 mg/dL (ref 0.0–1.2)
Bilirubin, Direct: <0.1 mg/dL (ref 0.00–0.40)
Total Protein: 6.6 g/dL (ref 6.0–8.5)

## 2021-03-28 MED ORDER — TECHNETIUM TC 99M TETROFOSMIN IV KIT
29.6000 | PACK | Freq: Once | INTRAVENOUS | Status: AC | PRN
  Administered 2021-03-28: 29.6 via INTRAVENOUS
  Filled 2021-03-28: qty 30

## 2021-03-28 MED ORDER — TECHNETIUM TC 99M TETROFOSMIN IV KIT
9.8000 | PACK | Freq: Once | INTRAVENOUS | Status: AC | PRN
  Administered 2021-03-28: 9.8 via INTRAVENOUS
  Filled 2021-03-28: qty 10

## 2021-04-03 ENCOUNTER — Other Ambulatory Visit: Payer: Self-pay | Admitting: Cardiovascular Disease

## 2021-05-02 ENCOUNTER — Other Ambulatory Visit: Payer: Self-pay | Admitting: Cardiovascular Disease

## 2021-05-10 ENCOUNTER — Other Ambulatory Visit: Payer: Self-pay | Admitting: Cardiovascular Disease

## 2021-12-21 ENCOUNTER — Telehealth: Payer: Self-pay | Admitting: Cardiovascular Disease

## 2021-12-21 NOTE — Telephone Encounter (Signed)
   Patient Name: Marc Montes  DOB: 27-Oct-1950 MRN: [id number]  Primary Cardiologist: Mertie Moores, MD  Chart reviewed as part of pre-operative protocol coverage.   Simple dental extractions (i.e. 1-2 teeth) are considered low risk procedures per guidelines and generally do not require any specific cardiac clearance. It is also generally accepted that for simple extractions and dental cleanings, there is no need to interrupt blood thinner or antiplatelet therapy.  If more than 2 teeth need to be extracted, requesting office will need to submit a separate request outlining procedure details.   SBE prophylaxis is not required for the patient from a cardiac standpoint.  I will route this recommendation to the requesting party via Epic fax function and remove from pre-op pool.  Please call with questions.  Tami Lin Cadarius Nevares, PA 12/21/2021, 10:59 AM

## 2021-12-21 NOTE — Telephone Encounter (Signed)
   Pre-operative Risk Assessment    Patient Name: Marc Montes  DOB: 1951/06/03 MRN: [id number]     Request for Surgical Clearance    Procedure:   fillings, cleaning, and possible extractions.   Date of Surgery:  Clearance TBD                                 Surgeon:   Surgeon's Group or Practice Name:  [facility]  Phone number:  [phone] Fax number:  [phone]   Type of Clearance Requested:   - Medical  - Pharmacy:  Hold Clopidogrel (Plavix)     Type of Anesthesia:  Local    Additional requests/questions:  Does this patient need antibiotics? Per Loma Sousa, they were not sure yet how many teeth are going to be extracted because while doing a fillings that's when the dentist can tell if the tooth needs to extracted.   Dorthey Sawyer   12/21/2021, 10:30 AM

## 2022-03-16 ENCOUNTER — Encounter: Payer: Self-pay | Admitting: Cardiovascular Disease
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
## 2022-03-17 ENCOUNTER — Encounter: Payer: Self-pay | Admitting: Cardiovascular Disease

## 2022-03-17 ENCOUNTER — Ambulatory Visit: Payer: Medicare Other | Attending: Cardiovascular Disease | Admitting: Cardiovascular Disease

## 2022-03-17 VITALS — BP 125/65 | HR 55 | Ht 68.5 in | Wt 185.2 lb

## 2022-03-17 DIAGNOSIS — I25118 Atherosclerotic heart disease of native coronary artery with other forms of angina pectoris: Secondary | ICD-10-CM | POA: Diagnosis present

## 2022-03-17 DIAGNOSIS — E785 Hyperlipidemia, unspecified: Secondary | ICD-10-CM

## 2022-03-17 LAB — BASIC METABOLIC PANEL
BUN/Creatinine Ratio: 12 (ref 10–24)
BUN: 11 mg/dL (ref 8–27)
CO2: 25 mmol/L (ref 20–29)
Calcium: 8.6 mg/dL (ref 8.6–10.2)
Chloride: 106 mmol/L (ref 96–106)
Creatinine, Ser: 0.89 mg/dL (ref 0.76–1.27)
Glucose: 82 mg/dL (ref 70–99)
Potassium: 3.8 mmol/L (ref 3.5–5.2)
Sodium: 142 mmol/L (ref 134–144)
eGFR: 92 mL/min/{1.73_m2} (ref >59)

## 2022-03-17 LAB — LIPID PANEL
Chol/HDL Ratio: 2.3 ratio (ref 0.0–5.0)
Cholesterol, Total: 97 mg/dL — ABNORMAL LOW (ref 100–199)
HDL: 43 mg/dL (ref >39)
LDL Chol Calc (NIH): 39 mg/dL (ref 0–99)
Triglycerides: 66 mg/dL (ref 0–149)
VLDL Cholesterol Cal: 15 mg/dL (ref 5–40)

## 2022-03-17 LAB — ALT: ALT: 18 IU/L (ref 0–44)

## 2022-03-17 NOTE — Patient Instructions (Addendum)
Medication Instructions:  Your physician recommends that you continue on your current medications as directed. Please refer to the Current Medication list given to you today.  *If you need a refill on your cardiac medications before your next appointment, please call your pharmacy*  Lab Work: Your physician recommends that you have lab work today- Lipids, ALT, BMET  If you have labs (blood work) drawn today and your tests are completely normal, you will receive your results only by: MyChart Message (if you have MyChart) OR A paper copy in the mail If you have any lab test that is abnormal or we need to change your treatment, we will call you to review the results.  Testing/Procedures: None ordered today.  Follow-Up: At [facility], you and your health needs are our priority.  As part of our continuing mission to provide you with exceptional heart care, we have created designated Provider Care Teams.  These Care Teams include your primary Cardiologist (physician) and Advanced Practice Providers (APPs -  Physician Assistants and Nurse Practitioners) who all work together to provide you with the care you need, when you need it.  We recommend signing up for the patient portal called "MyChart".  Sign up information is provided on this After Visit Summary.  MyChart is used to connect with patients for Virtual Visits (Telemedicine).  Patients are able to view lab/test results, encounter notes, upcoming appointments, etc.  Non-urgent messages can be sent to your provider as well.   To learn more about what you can do with MyChart, go to [email or web address].    Your next appointment:   12  month(s)  The format for your next appointment:   In Person  Provider:   Mertie Moores, MD      Important Information About Sugar

## 2022-03-20 ENCOUNTER — Telehealth: Payer: Self-pay | Admitting: Cardiovascular Disease

## 2022-03-20 NOTE — Telephone Encounter (Signed)
Returned call to patient and relayed below results:   Thayer Headings, MD  03/17/2022  5:55 PM EDT     Lipid levels look great.  LDL is 39. ALT is 18 Basic metabolic profile is within normal limits

## 2022-03-20 NOTE — Telephone Encounter (Signed)
Follow Up:      Patient is returning call, concerning his lab results

## 2022-04-07 ENCOUNTER — Other Ambulatory Visit: Payer: Self-pay | Admitting: Cardiovascular Disease

## 2022-04-14 ENCOUNTER — Other Ambulatory Visit: Payer: Self-pay | Admitting: Cardiovascular Disease

## 2022-07-16 ENCOUNTER — Other Ambulatory Visit: Payer: Self-pay | Admitting: Cardiovascular Disease

## 2023-03-27 ENCOUNTER — Other Ambulatory Visit: Payer: Self-pay | Admitting: Cardiovascular Disease

## 2023-04-01 ENCOUNTER — Encounter: Payer: Self-pay | Admitting: Cardiovascular Disease
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
## 2023-04-02 ENCOUNTER — Ambulatory Visit: Payer: Medicare Other | Attending: Cardiovascular Disease | Admitting: Cardiovascular Disease

## 2023-04-02 VITALS — BP 100/60 | HR 57 | Ht 68.0 in | Wt 173.0 lb

## 2023-04-02 DIAGNOSIS — I25118 Atherosclerotic heart disease of native coronary artery with other forms of angina pectoris: Secondary | ICD-10-CM | POA: Diagnosis not present

## 2023-04-02 DIAGNOSIS — Z79899 Other long term (current) drug therapy: Secondary | ICD-10-CM | POA: Insufficient documentation

## 2023-04-02 DIAGNOSIS — E785 Hyperlipidemia, unspecified: Secondary | ICD-10-CM | POA: Diagnosis present

## 2023-04-02 LAB — BASIC METABOLIC PANEL
BUN/Creatinine Ratio: 9 — ABNORMAL LOW (ref 10–24)
BUN: 9 mg/dL (ref 8–27)
CO2: 24 mmol/L (ref 20–29)
Calcium: 9.9 mg/dL (ref 8.6–10.2)
Chloride: 106 mmol/L (ref 96–106)
Creatinine, Ser: 0.97 mg/dL (ref 0.76–1.27)
Glucose: 101 mg/dL — ABNORMAL HIGH (ref 70–99)
Potassium: 4.6 mmol/L (ref 3.5–5.2)
Sodium: 142 mmol/L (ref 134–144)
eGFR: 82 mL/min/{1.73_m2} (ref >59)

## 2023-04-02 LAB — LIPID PANEL
Chol/HDL Ratio: 2.4 {ratio} (ref 0.0–5.0)
Cholesterol, Total: 115 mg/dL (ref 100–199)
HDL: 47 mg/dL (ref >39)
LDL Chol Calc (NIH): 51 mg/dL (ref 0–99)
Triglycerides: 86 mg/dL (ref 0–149)
VLDL Cholesterol Cal: 17 mg/dL (ref 5–40)

## 2023-04-02 LAB — ALT: ALT: 19 [IU]/L (ref 0–44)

## 2023-04-02 NOTE — Patient Instructions (Addendum)
Medication Instructions:   Your physician recommends that you continue on your current medications as directed. Please refer to the Current Medication list given to you today.  *If you need a refill on your cardiac medications before your next appointment, please call your pharmacy*   Lab Work:  TODAY--BMET, LIPIDS, AND ALT  If you have labs (blood work) drawn today and your tests are completely normal, you will receive your results only by: MyChart Message (if you have MyChart) OR A paper copy in the mail If you have any lab test that is abnormal or we need to change your treatment, we will call you to review the results.    Follow-Up: At [facility], you and your health needs are our priority.  As part of our continuing mission to provide you with exceptional heart care, we have created designated Provider Care Teams.  These Care Teams include your primary Cardiologist (physician) and Advanced Practice Providers (APPs -  Physician Assistants and Nurse Practitioners) who all work together to provide you with the care you need, when you need it.  We recommend signing up for the patient portal called "MyChart".  Sign up information is provided on this After Visit Summary.  MyChart is used to connect with patients for Virtual Visits (Telemedicine).  Patients are able to view lab/test results, encounter notes, upcoming appointments, etc.  Non-urgent messages can be sent to your provider as well.   To learn more about what you can do with MyChart, go to [email or web address].    Your next appointment:   1 year(s)  Provider:   DR. Bjorn Pippin

## 2023-04-04 ENCOUNTER — Other Ambulatory Visit: Payer: Self-pay | Admitting: Cardiovascular Disease

## 2023-06-26 ENCOUNTER — Other Ambulatory Visit: Payer: Self-pay | Admitting: Cardiovascular Disease

## 2024-03-11 ENCOUNTER — Other Ambulatory Visit: Payer: Self-pay

## 2024-03-13 MED ORDER — NITROGLYCERIN 0.4 MG SL SUBL: SUBLINGUAL_TABLET | SUBLINGUAL | 0 refills | Status: AC

## 2024-03-30 NOTE — Progress Notes (Unsigned)
 Cardiology Office Note:    Date:  04/01/2024   ID:  Marc Montes, Marc Montes 03-24-1951, MRN [id number]  PCP:  Favero, John Patrick, DO  Cardiologist:  Aleene Passe, MD (Inactive)  Electrophysiologist:  None   Referring MD: Favero, John Patrick, DO   Chief Complaint  Patient presents with   Coronary Artery Disease    History of Present Illness:    Marc Montes is a 73 y.o. male with a hx of CAD, hyperlipidemia, melanoma who presents for follow-up.  Previously followed with Dr. Passe.  History of PCI to LAD with BMS in 2002.  Cath in 2017 showed 85% mid LAD stenosis status post DES.  Since last clinic visit, he reports he is doing okay.  States that he has been having some chest tightness, particular notices it when walking uphill.  He will rest and it will resolve.  He has not used his nitroglycerin .  He does report a history of syncopal episodes but not over the last 2 years.  States he typically feels cold sweat and nauseous and then will pass out.  Does report having some lower extremity edema.  He denies any palpitations.  He is on DAPT, denies any bleeding issues.    Past Medical History:  Diagnosis Date   Basal cell carcinoma of face    burned off   CAD (coronary artery disease)    a. PCI to LAD in 2002 b. 02/2016: Cath w/ 85% stenosis mid-LAD, DES placed.   Chronic lower back pain    Exposure to TB    GERD (gastroesophageal reflux disease)    History of hiatal hernia    Hyperlipidemia    simvastatin caused severe myalgias. Tolerated pravastatin.    Melanoma of lip (HCC) 2016   upper lip   Peripheral neuropathy    Refusal of blood transfusions as patient is Jehovah's Witness     Past Surgical History:  Procedure Laterality Date   CARDIAC CATHETERIZATION  01/2010   CARDIAC CATHETERIZATION N/A 02/03/2016   Procedure: Left Heart Cath and Coronary Angiography;  Surgeon: Ezra GORMAN Shuck, MD;  Location: [facility] INVASIVE CV LAB;  Service: Cardiovascular;  Laterality: N/A;    CARDIAC CATHETERIZATION N/A 02/03/2016   Procedure: Intravascular Pressure Wire/FFR Study;  Surgeon: Lonni Hanson, MD;  Location: [facility] INVASIVE CV LAB;  Service: Cardiovascular;  Laterality: N/A;   CARDIAC CATHETERIZATION N/A 02/03/2016   Procedure: Coronary Stent Intervention;  Surgeon: Lonni Hanson, MD;  Location: [facility] INVASIVE CV LAB;  Service: Cardiovascular;  Laterality: N/A;   CARDIAC CATHETERIZATION N/A 02/03/2016   Procedure: Intravascular Ultrasound/IVUS;  Surgeon: Lonni Hanson, MD;  Location: [facility] INVASIVE CV LAB;  Service: Cardiovascular;  Laterality: N/A;   CORONARY ANGIOPLASTY WITH STENT PLACEMENT  05/2001; 02/03/2016   1 stent; 1 stent   MELANOMA EXCISION  2016   upper lip   POSTERIOR LUMBAR FUSION  10/2001   Thoracolumbar decompressive laminectomy with bilateral L1 transpedicular decompressions and open reduction of L1 fracture. Posterolateral fusion from T11 to L3 using segmental pedicle screw instrumentation/notes 11/15/2010   TONSILLECTOMY      Current Medications: Current Meds  Medication Sig   amLODipine (NORVASC) 5 MG tablet Take 1 tablet (5 mg total) by mouth daily.   Ascorbic Acid (VITAMIN C) 100 MG tablet Take 100 mg by mouth daily.   aspirin  81 MG tablet Take 81 mg by mouth daily.     Calcium  Carbonate (CALCIUM  600 PO) Take 1 tablet by mouth daily.   clopidogrel  (PLAVIX ) 75 MG  tablet Take 1 tablet by mouth once daily   CRESTOR  10 MG tablet TAKE ONE TABLET BY MOUTH ONCE DAILY (MAKE AN APPOINTMENT FOR FURTHER REFILLS)   gabapentin  (NEURONTIN ) 600 MG tablet Take 600 mg by mouth 2 (two) times daily.    GLUCOSAMINE HCL-MSM PO Take 1 tablet by mouth daily.   isosorbide  mononitrate (IMDUR ) 60 MG 24 hr tablet Take 1 tablet by mouth once daily   methocarbamol  (ROBAXIN ) 500 MG tablet Take 500 mg by mouth every 8 (eight) hours as needed for muscle spasms.    Multiple Vitamin (MULTIVITAMIN) tablet Take 1 tablet by mouth daily.     nitroGLYCERIN  (NITROSTAT ) 0.4 MG SL tablet  DISSOLVE ONE TABLET UNDER THE TONGUE EVERY 5 MINUTES AS NEEDED FOR CHEST PAIN.  DO NOT EXCEED A TOTAL OF 3 DOSES IN 15 MINUTES   Omega-3 Fatty Acids (FISH OIL) 1200 MG CAPS Take 1 capsule by mouth daily.   oxyCODONE -acetaminophen  (PERCOCET) 5-325 MG per tablet Take 1 tablet by mouth every 8 (eight) hours as needed for moderate pain.    tamsulosin  (FLOMAX ) 0.4 MG CAPS capsule Take 0.4 mg by mouth daily.   vitamin B-12 (CYANOCOBALAMIN) 100 MCG tablet Take 100 mcg by mouth daily.   [DISCONTINUED] carvedilol  (COREG ) 6.25 MG tablet TAKE 1 TABLET BY MOUTH TWICE DAILY WITH A MEAL     Allergies:   Simvastatin and Tuberculin tests   Social History   Socioeconomic History   Marital status: Married    Spouse name: Not on file   Number of children: Not on file   Years of education: Not on file   Highest education level: Not on file  Occupational History   Not on file  Tobacco Use   Smoking status: Never   Smokeless tobacco: Never  Substance and Sexual Activity   Alcohol use: No   Drug use: No   Sexual activity: Never  Other Topics Concern   Not on file  Social History Narrative   Retired Curator, disability from back injury.    Married-Lives in [place]   Alcohol Use - no   Tobacco Use - No.    Smoking Status:  never   Social Drivers of Corporate investment banker Strain: Not on file  Food Insecurity: Not on file  Transportation Needs: Not on file  Physical Activity: Not on file  Stress: Not on file  Social Connections: Not on file     Family History: The patient's family history includes Heart attack in his father; Stroke in his mother.  ROS:   Please see the history of present illness.     All other systems reviewed and are negative.  EKGs/Labs/Other Studies Reviewed:    The following studies were reviewed today:   EKG:   03/31/24: Sinus bradycardia, rate 44, first-degree AV block  Recent Labs: 03/31/2024: ALT 18; BUN 12; Creatinine, Ser 1.02; Hemoglobin 13.6;  Platelets 174; Potassium 4.8; Sodium 142  Recent Lipid Panel    Component Value Date/Time   CHOL 119 03/31/2024 1026   TRIG 72 03/31/2024 1026   HDL 47 03/31/2024 1026   CHOLHDL 2.5 03/31/2024 1026   CHOLHDL 3.1 01/24/2016 1638   VLDL 32 (H) 01/24/2016 1638   LDLCALC 57 03/31/2024 1026    Physical Exam:    VS:  BP (!) 170/94 (BP Location: Left Arm, Patient Position: Sitting)   Pulse (!) 44   Ht 5' 8 (1.727 m)   Wt 183 lb 3.2 oz (83.1 kg)   SpO2 96%  BMI 27.86 kg/m     Wt Readings from Last 3 Encounters:  03/31/24 183 lb 3.2 oz (83.1 kg)  04/02/23 173 lb (78.5 kg)  03/17/22 185 lb 3.2 oz (84 kg)     GEN:  Well nourished, well developed in no acute distress HEENT: Normal NECK: No JVD; No carotid bruits LYMPHATICS: No lymphadenopathy CARDIAC: Bradycardic, regular, no murmurs, rubs, gallops RESPIRATORY:  Clear to auscultation without rales, wheezing or rhonchi  ABDOMEN: Soft, non-tender, non-distended MUSCULOSKELETAL:  No edema; No deformity  SKIN: Warm and dry NEUROLOGIC:  Alert and oriented x 3 PSYCHIATRIC:  Normal affect   ASSESSMENT:    1. Coronary artery disease of native artery of native heart with stable angina pectoris   2. Chest pain of uncertain etiology   3. Medication management   4. Hyperlipidemia, unspecified hyperlipidemia type   5. Essential hypertension   6. Bradycardia    PLAN:    CAD: History of PCI to LAD with BMS in 2002.  Cath in 2017 showed 85% mid LAD stenosis status post DES. - Continue aspirin  81 mg daily, Plavix  75 mg daily - Continue Imdur  60 mg daily - Bradycardic in clinic today, will discontinue carvedilol .   - Continue rosuvastatin  10 mg daily - He is reporting exertional chest.  Recommend stress PET to evaluate for ischemia.  Will also check echocardiogram to evaluate for structural heart disease  Bradycardia: Sinus bradycardia with heart rate 44 bpm in clinic today.  Currently asymptomatic but does report intermittent  lightheadedness and has had syncopal episodes, though none recently.  Will discontinue carvedilol   Hyperlipidemia:on rosuvastatin  10mg .  LDL 51 on 04/02/2023.  Will check lipid panel  Hypertension: On carvedilol  and Imdur .  BP elevated in clinic today but reports has been under good control.  Given his bradycardia we will discontinue carvedilol .  Add amlodipine 5 mg daily and asked to check BP daily for next 2 weeks and let us  know results  RTC in 3 months  Informed Consent   Shared Decision Making/Informed Consent The risks [chest pain, shortness of breath, cardiac arrhythmias, dizziness, blood pressure fluctuations, myocardial infarction, stroke/transient ischemic attack, nausea, vomiting, allergic reaction, radiation exposure, metallic taste sensation and life-threatening complications (estimated to be 1 in 10,000)], benefits (risk stratification, diagnosing coronary artery disease, treatment guidance) and alternatives of a cardiac PET stress test were discussed in detail with Mr. Keller and he agrees to proceed.       Medication Adjustments/Labs and Tests Ordered: Current medicines are reviewed at length with the patient today.  Concerns regarding medicines are outlined above.  Orders Placed This Encounter  Procedures   NM PET CT CARDIAC PERFUSION MULTI W/ABSOLUTE BLOODFLOW   Comprehensive metabolic panel with GFR   CBC   Lipid panel   EKG 12-Lead   ECHOCARDIOGRAM COMPLETE   Meds ordered this encounter  Medications   amLODipine (NORVASC) 5 MG tablet    Sig: Take 1 tablet (5 mg total) by mouth daily.    Dispense:  90 tablet    Refill:  3    Patient Instructions  Medication Instructions:  Start Amlodipine 18m daily Stop Coreg  as discussed with your provider *If you need a refill on your cardiac medications before your next appointment, please call your pharmacy*  Lab Work:  cbc. Cmet, lipid today If you have labs (blood work) drawn today and your tests are completely normal,  you will receive your results only by: MyChart Message (if you have MyChart) OR A paper copy in the  mail If you have any lab test that is abnormal or we need to change your treatment, we will call you to review the results.  Testing/Procedures: Echo  Your physician has requested that you have an echocardiogram. Echocardiography is a painless test that uses sound waves to create images of your heart. It provides your doctor with information about the size and shape of your heart and how well your heart's chambers and valves are working. This procedure takes approximately one hour. There are no restrictions for this procedure. Please do NOT wear cologne, perfume, aftershave, or lotions (deodorant is allowed). Please arrive 15 minutes prior to your appointment time.  Please note: We ask at that you not bring children with you during ultrasound (echo/ vascular) testing. Due to room size and safety concerns, children are not allowed in the ultrasound rooms during exams. Our front office staff cannot provide observation of children in our lobby area while testing is being conducted. An adult accompanying a patient to their appointment will only be allowed in the ultrasound room at the discretion of the ultrasound technician under special circumstances. We apologize for any inconvenience.   Follow-Up: At [facility], you and your health needs are our priority.  As part of our continuing mission to provide you with exceptional heart care, our providers are all part of one team.  This team includes your primary Cardiologist (physician) and Advanced Practice Providers or APPs (Physician Assistants and Nurse Practitioners) who all work together to provide you with the care you need, when you need it.  Your next appointment:   3 months  Provider:   Dr. Kate   We recommend signing up for the patient portal called MyChart.  Sign up information is provided on this After Visit Summary.   MyChart is used to connect with patients for Virtual Visits (Telemedicine).  Patients are able to view lab/test results, encounter notes, upcoming appointments, etc.  Non-urgent messages can be sent to your provider as well.   To learn more about what you can do with MyChart, go to [email or web address].   Other Instructions    Please report to Radiology at the [facility] Main Entrance 30 minutes early for your test.  [address] [place], [place] [address]                     How to Prepare for Your Cardiac PET/CT Stress Test:  Nothing to eat or drink, except water, 3 hours prior to arrival time.  NO caffeine/decaffeinated products, or chocolate 12 hours prior to arrival. (Please note decaffeinated beverages (teas/coffees) still contain caffeine).  If you have caffeine within 12 hours prior, the test will need to be rescheduled.  Medication instructions: Do not take erectile dysfunction medications for 72 hours prior to test (sildenafil, tadalafil) Do not take nitrates (isosorbide  mononitrate, Ranexa) the day before or day of test Do not take tamsulosin  the day before or morning of test Hold theophylline containing medications for 12 hours. Hold Dipyridamole 48 hours prior to the test.  Diabetic Preparation: If able to eat breakfast prior to 3 hour fasting, you may take all medications, including your insulin. Do not worry if you miss your breakfast dose of insulin - start at your next meal. If you do not eat prior to 3 hour fast-Hold all diabetes (oral and insulin) medications. Patients who wear a continuous glucose monitor MUST remove the device prior to scanning.  You may take your remaining medications with  water.  NO perfume, cologne or lotion on chest or abdomen area.   Total time is 1 to 2 hours; you may want to bring reading material for the waiting time.  IF YOU THINK YOU MAY BE PREGNANT, OR ARE NURSING PLEASE INFORM THE TECHNOLOGIST.  In preparation  for your appointment, medication and supplies will be purchased.  Appointment availability is limited, so if you need to cancel or reschedule, please call the Radiology Department Scheduler at [phone] 24 hours in advance to avoid a cancellation fee of $100.00  What to Expect When you Arrive:  Once you arrive and check in for your appointment, you will be taken to a preparation room within the Radiology Department.  A technologist or Nurse will obtain your medical history, verify that you are correctly prepped for the exam, and explain the procedure.  Afterwards, an IV will be started in your arm and electrodes will be placed on your skin for EKG monitoring during the stress portion of the exam. Then you will be escorted to the PET/CT scanner.  There, staff will get you positioned on the scanner and obtain a blood pressure and EKG.  During the exam, you will continue to be connected to the EKG and blood pressure machines.  A small, safe amount of a radioactive tracer will be injected in your IV to obtain a series of pictures of your heart along with an injection of a stress agent.    After your Exam:  It is recommended that you eat a meal and drink a caffeinated beverage to counter act any effects of the stress agent.  Drink plenty of fluids for the remainder of the day and urinate frequently for the first couple of hours after the exam.  Your doctor will inform you of your test results within 7-10 business days.  For more information and frequently asked questions, please visit our website: [email or web address]  For questions about your test or how to prepare for your test, please call: Cardiac Imaging Nurse Navigators Office: [phone]      Please check blood pressure once a day for next 2 weeks and send those reading.      Signed, Lonni LITTIE Nanas, MD  04/01/2024 12:11 PM    [facility]

## 2024-03-31 ENCOUNTER — Ambulatory Visit: Attending: Cardiology | Admitting: Cardiology

## 2024-03-31 VITALS — BP 170/94 | HR 44 | Ht 68.0 in | Wt 183.2 lb

## 2024-03-31 DIAGNOSIS — Z79899 Other long term (current) drug therapy: Secondary | ICD-10-CM | POA: Insufficient documentation

## 2024-03-31 DIAGNOSIS — E785 Hyperlipidemia, unspecified: Secondary | ICD-10-CM | POA: Diagnosis present

## 2024-03-31 DIAGNOSIS — I25118 Atherosclerotic heart disease of native coronary artery with other forms of angina pectoris: Secondary | ICD-10-CM | POA: Insufficient documentation

## 2024-03-31 DIAGNOSIS — R079 Chest pain, unspecified: Secondary | ICD-10-CM | POA: Diagnosis present

## 2024-03-31 DIAGNOSIS — I10 Essential (primary) hypertension: Secondary | ICD-10-CM | POA: Diagnosis present

## 2024-03-31 DIAGNOSIS — R001 Bradycardia, unspecified: Secondary | ICD-10-CM | POA: Diagnosis present

## 2024-03-31 LAB — LIPID PANEL

## 2024-03-31 MED ORDER — AMLODIPINE BESYLATE 5 MG PO TABS: 5.0000 mg | ORAL_TABLET | Freq: Every day | ORAL | 3 refills | Status: AC

## 2024-03-31 NOTE — Patient Instructions (Signed)
 Medication Instructions:  Start Amlodipine 8m daily Stop Coreg  as discussed with your provider *If you need a refill on your cardiac medications before your next appointment, please call your pharmacy*  Lab Work:  cbc. Cmet, lipid today If you have labs (blood work) drawn today and your tests are completely normal, you will receive your results only by: MyChart Message (if you have MyChart) OR A paper copy in the mail If you have any lab test that is abnormal or we need to change your treatment, we will call you to review the results.  Testing/Procedures: Echo  Your physician has requested that you have an echocardiogram. Echocardiography is a painless test that uses sound waves to create images of your heart. It provides your doctor with information about the size and shape of your heart and how well your heart's chambers and valves are working. This procedure takes approximately one hour. There are no restrictions for this procedure. Please do NOT wear cologne, perfume, aftershave, or lotions (deodorant is allowed). Please arrive 15 minutes prior to your appointment time.  Please note: We ask at that you not bring children with you during ultrasound (echo/ vascular) testing. Due to room size and safety concerns, children are not allowed in the ultrasound rooms during exams. Our front office staff cannot provide observation of children in our lobby area while testing is being conducted. An adult accompanying a patient to their appointment will only be allowed in the ultrasound room at the discretion of the ultrasound technician under special circumstances. We apologize for any inconvenience.   Follow-Up: At [facility], you and your health needs are our priority.  As part of our continuing mission to provide you with exceptional heart care, our providers are all part of one team.  This team includes your primary Cardiologist (physician) and Advanced Practice Providers or APPs  (Physician Assistants and Nurse Practitioners) who all work together to provide you with the care you need, when you need it.  Your next appointment:   3 months  Provider:   Dr. Kate   We recommend signing up for the patient portal called MyChart.  Sign up information is provided on this After Visit Summary.  MyChart is used to connect with patients for Virtual Visits (Telemedicine).  Patients are able to view lab/test results, encounter notes, upcoming appointments, etc.  Non-urgent messages can be sent to your provider as well.   To learn more about what you can do with MyChart, go to [email or web address].   Other Instructions    Please report to Radiology at the [facility] Main Entrance 30 minutes early for your test.  [address] [place], [place] [address]                     How to Prepare for Your Cardiac PET/CT Stress Test:  Nothing to eat or drink, except water, 3 hours prior to arrival time.  NO caffeine/decaffeinated products, or chocolate 12 hours prior to arrival. (Please note decaffeinated beverages (teas/coffees) still contain caffeine).  If you have caffeine within 12 hours prior, the test will need to be rescheduled.  Medication instructions: Do not take erectile dysfunction medications for 72 hours prior to test (sildenafil, tadalafil) Do not take nitrates (isosorbide  mononitrate, Ranexa) the day before or day of test Do not take tamsulosin  the day before or morning of test Hold theophylline containing medications for 12 hours. Hold Dipyridamole 48 hours prior to the test.  Diabetic Preparation: If  able to eat breakfast prior to 3 hour fasting, you may take all medications, including your insulin. Do not worry if you miss your breakfast dose of insulin - start at your next meal. If you do not eat prior to 3 hour fast-Hold all diabetes (oral and insulin) medications. Patients who wear a continuous glucose monitor MUST remove the device prior  to scanning.  You may take your remaining medications with water.  NO perfume, cologne or lotion on chest or abdomen area.   Total time is 1 to 2 hours; you may want to bring reading material for the waiting time.  IF YOU THINK YOU MAY BE PREGNANT, OR ARE NURSING PLEASE INFORM THE TECHNOLOGIST.  In preparation for your appointment, medication and supplies will be purchased.  Appointment availability is limited, so if you need to cancel or reschedule, please call the Radiology Department Scheduler at [phone] 24 hours in advance to avoid a cancellation fee of $100.00  What to Expect When you Arrive:  Once you arrive and check in for your appointment, you will be taken to a preparation room within the Radiology Department.  A technologist or Nurse will obtain your medical history, verify that you are correctly prepped for the exam, and explain the procedure.  Afterwards, an IV will be started in your arm and electrodes will be placed on your skin for EKG monitoring during the stress portion of the exam. Then you will be escorted to the PET/CT scanner.  There, staff will get you positioned on the scanner and obtain a blood pressure and EKG.  During the exam, you will continue to be connected to the EKG and blood pressure machines.  A small, safe amount of a radioactive tracer will be injected in your IV to obtain a series of pictures of your heart along with an injection of a stress agent.    After your Exam:  It is recommended that you eat a meal and drink a caffeinated beverage to counter act any effects of the stress agent.  Drink plenty of fluids for the remainder of the day and urinate frequently for the first couple of hours after the exam.  Your doctor will inform you of your test results within 7-10 business days.  For more information and frequently asked questions, please visit our website: [email or web address]  For questions about your test or how to prepare for your  test, please call: Cardiac Imaging Nurse Navigators Office: [phone]      Please check blood pressure once a day for next 2 weeks and send those reading.

## 2024-04-01 ENCOUNTER — Ambulatory Visit: Payer: Self-pay | Admitting: Cardiology

## 2024-04-01 DIAGNOSIS — I25118 Atherosclerotic heart disease of native coronary artery with other forms of angina pectoris: Secondary | ICD-10-CM

## 2024-04-01 DIAGNOSIS — I77819 Aortic ectasia, unspecified site: Secondary | ICD-10-CM

## 2024-04-01 DIAGNOSIS — I350 Nonrheumatic aortic (valve) stenosis: Secondary | ICD-10-CM

## 2024-04-01 DIAGNOSIS — I77810 Thoracic aortic ectasia: Secondary | ICD-10-CM

## 2024-04-01 LAB — CBC
Hematocrit: 40.5 % (ref 37.5–51.0)
Hemoglobin: 13.6 g/dL (ref 13.0–17.7)
MCH: 30.3 pg (ref 26.6–33.0)
MCHC: 33.6 g/dL (ref 31.5–35.7)
MCV: 90 fL (ref 79–97)
Platelets: 174 x10E3/uL (ref 150–450)
RBC: 4.49 x10E6/uL (ref 4.14–5.80)
RDW: 12.3 % (ref 11.6–15.4)
WBC: 6.3 x10E3/uL (ref 3.4–10.8)

## 2024-04-01 LAB — COMPREHENSIVE METABOLIC PANEL WITH GFR
ALT: 18 IU/L (ref 0–44)
AST: 23 IU/L (ref 0–40)
Albumin: 4 g/dL (ref 3.8–4.8)
Alkaline Phosphatase: 74 IU/L (ref 47–123)
BUN/Creatinine Ratio: 12 (ref 10–24)
BUN: 12 mg/dL (ref 8–27)
Bilirubin Total: 0.5 mg/dL (ref 0.0–1.2)
CO2: 22 mmol/L (ref 20–29)
Calcium: 9.3 mg/dL (ref 8.6–10.2)
Chloride: 106 mmol/L (ref 96–106)
Creatinine, Ser: 1.02 mg/dL (ref 0.76–1.27)
Globulin, Total: 2.4 g/dL (ref 1.5–4.5)
Glucose: 91 mg/dL (ref 70–99)
Potassium: 4.8 mmol/L (ref 3.5–5.2)
Sodium: 142 mmol/L (ref 134–144)
Total Protein: 6.4 g/dL (ref 6.0–8.5)
eGFR: 77 mL/min/1.73 (ref >59)

## 2024-04-01 LAB — LIPID PANEL
Cholesterol, Total: 119 mg/dL (ref 100–199)
HDL: 47 mg/dL (ref >39)
LDL CALC COMMENT:: 2.5 ratio (ref 0.0–5.0)
LDL Chol Calc (NIH): 57 mg/dL (ref 0–99)
Triglycerides: 72 mg/dL (ref 0–149)
VLDL Cholesterol Cal: 15 mg/dL (ref 5–40)

## 2024-04-25 ENCOUNTER — Telehealth: Payer: Self-pay | Admitting: *Deleted

## 2024-04-25 ENCOUNTER — Telehealth: Payer: Self-pay | Admitting: Cardiology

## 2024-04-25 NOTE — Telephone Encounter (Signed)
 Dr informed pt to call back with BP readings please advise   Oct 2 121/69   HR 55 141/71  HR 53 144/78  HR 59 145/70  HR 55 119/63   HR 72 121/63  HR  69 122/68  HR  60 150/77 HR 56 142/73  HR 55 117/68 HR 53 114/64 HR 66 132/67 HR 58 112/63 HR 61 132/63 HR 60

## 2024-04-25 NOTE — Telephone Encounter (Signed)
 Called patient and left message to call office for any questions. Left message that blood pressure log will be sent to provider.

## 2024-04-27 NOTE — Telephone Encounter (Signed)
BP intermittently elevated but overall looks okay, would not recommend any changes at this time

## 2024-04-28 NOTE — Telephone Encounter (Signed)
 Called patient and made patient aware per Dr. Kate blood pressure intermittently elevated, but no changes recommended at this time. Pt verbalized an understanding

## 2024-05-05 ENCOUNTER — Telehealth: Payer: Self-pay | Admitting: Cardiovascular Disease

## 2024-05-05 DIAGNOSIS — R079 Chest pain, unspecified: Secondary | ICD-10-CM

## 2024-05-05 NOTE — Telephone Encounter (Signed)
 Left message for patient to return the call.

## 2024-05-05 NOTE — Telephone Encounter (Signed)
 Pt c/o medication issue:  1. Name of Medication:    2. How are you currently taking this medication (dosage and times per day)?    3. Are you having a reaction (difficulty breathing--STAT)? no  4. What is your medication issue? Patient calling in about the changes that was made to his medication on his last visit. He states he wants it to go back to what it was before. Didn't have medication with him. Please advise

## 2024-05-07 ENCOUNTER — Other Ambulatory Visit: Payer: Self-pay

## 2024-05-07 NOTE — Telephone Encounter (Signed)
 Spoke with pt regarding his medications. Pt stated he is unsure of what changes were made to his medications at his last office visit but that he would like to go back to what he was taking because he felt better. Carvedilol  was discontinued and amlodipine was started. Since the change the pt stated he has noticed he has less strength and stamina and feel tired. Pt also stated when he exerts himself, such as when he works in the yard, he gets a tightness in his chest. Pt denied any other symptoms including chest pain or shortness of breath during our call. Pt was told that Dr. Kate would be notified. Pt verbalized understanding. All questions if any were answered.

## 2024-05-07 NOTE — Telephone Encounter (Signed)
 Patient was returning call. Please advise ?

## 2024-05-08 MED ORDER — CLOPIDOGREL BISULFATE 75 MG PO TABS: 75.0000 mg | ORAL_TABLET | Freq: Every day | ORAL | 3 refills | Status: AC

## 2024-05-08 NOTE — Telephone Encounter (Signed)
 We stopped his carvedilol  because his heart rate was running dangerously low, would not recommend restarting carvedilol .  For his chest pain, we ordered a stress PET but does not look like this has been scheduled-not clear why this has not been scheduled yet?  Can we get this scheduled ASAP?

## 2024-05-21 ENCOUNTER — Encounter (HOSPITAL_COMMUNITY): Payer: Self-pay | Admitting: Cardiology

## 2024-06-02 NOTE — Telephone Encounter (Signed)
 Called and made patient aware that Dr. Kate recommends not restarting Coreg  and recommended Stress PET. Patient verbalized an understanding.

## 2024-06-16 ENCOUNTER — Encounter (HOSPITAL_COMMUNITY): Payer: Self-pay

## 2024-06-17 ENCOUNTER — Inpatient Hospital Stay (HOSPITAL_COMMUNITY): Admission: RE | Admit: 2024-06-17 | Discharge: 2024-06-17 | Attending: Cardiology

## 2024-06-17 DIAGNOSIS — R079 Chest pain, unspecified: Secondary | ICD-10-CM | POA: Diagnosis present

## 2024-06-17 LAB — NM PET CT CARDIAC PERFUSION MULTI W/ABSOLUTE BLOODFLOW
LV dias vol: 96 mL (ref 62–150)
LV sys vol: 36 mL (ref 4.2–5.8)
MBFR: 2.22
Nuc Rest EF: 63 %
Nuc Stress EF: 71 %
Peak HR: 77 {beats}/min
Rest HR: 63 {beats}/min
Rest MBF: 0.87 ml/g/min
Rest Nuclear Isotope Dose: 21.5 mCi
ST Depression (mm): 0 mm
Stress MBF: 1.93 ml/g/min
Stress Nuclear Isotope Dose: 21.5 mCi

## 2024-06-17 MED ORDER — RUBIDIUM RB82 GENERATOR (RUBYFILL)
21.5400 | PACK | Freq: Once | INTRAVENOUS | Status: AC
  Administered 2024-06-17: 13:00:00 21.54 via INTRAVENOUS

## 2024-06-17 MED ORDER — REGADENOSON 0.4 MG/5ML IV SOLN
0.4000 mg | Freq: Once | INTRAVENOUS | Status: AC
  Administered 2024-06-17: 13:00:00 0.4 mg via INTRAVENOUS

## 2024-06-17 MED ORDER — RUBIDIUM RB82 GENERATOR (RUBYFILL)
21.4000 | PACK | Freq: Once | INTRAVENOUS | Status: AC
  Administered 2024-06-17: 13:00:00 21.4 via INTRAVENOUS

## 2024-06-17 MED ORDER — REGADENOSON 0.4 MG/5ML IV SOLN
INTRAVENOUS | Status: AC
  Filled 2024-06-17: qty 5

## 2024-06-18 ENCOUNTER — Ambulatory Visit: Payer: Self-pay | Admitting: Cardiology

## 2024-06-25 ENCOUNTER — Other Ambulatory Visit: Payer: Self-pay | Admitting: Cardiology

## 2024-06-25 MED ORDER — ISOSORBIDE MONONITRATE ER 60 MG PO TB24: 60.0000 mg | ORAL_TABLET | Freq: Every day | ORAL | 2 refills | Status: AC

## 2024-06-30 ENCOUNTER — Ambulatory Visit (HOSPITAL_COMMUNITY)
Admission: RE | Admit: 2024-06-30 | Discharge: 2024-06-30 | Disposition: A | Discharge status: Home / Self Care | Source: Ambulatory Visit | Attending: Cardiology | Admitting: Cardiology

## 2024-06-30 DIAGNOSIS — R079 Chest pain, unspecified: Secondary | ICD-10-CM | POA: Diagnosis not present

## 2024-06-30 LAB — ECHOCARDIOGRAM COMPLETE
Area-P 1/2: 3.48 cm2
MV M vel: 4.81 m/s
MV Peak grad: 92.5 mmHg
S' Lateral: 2.75 cm

## 2024-07-04 NOTE — Addendum Note (Signed)
 Addended by: TRUDY MIU R on: 07/04/2024 01:06 PM   Modules accepted: Orders

## 2024-07-06 NOTE — Progress Notes (Unsigned)
 " Cardiology Office Note:    Date:  07/07/2024   ID:  Marc Montes, DOB [birth year], MRN [id number]  PCP:  Favero, John Patrick, DO  Cardiologist:  Lonni LITTIE Nanas, MD  Electrophysiologist:  None   Referring MD: Favero, John Patrick, DO   Chief Complaint  Patient presents with   Coronary Artery Disease    History of Present Illness:    Marc Montes is a 74 y.o. male with a hx of CAD, hyperlipidemia, melanoma who presents for follow-up.  Previously followed with Dr. Alveta.  History of PCI to LAD with BMS in 2002.  Cath in 2017 showed 85% mid LAD stenosis status post DES.  Stress PET 06/17/2024 showed normal perfusion, normal myocardial blood flow reserve, LVEF 63%.  Echocardiogram 06/30/2024 showed EF 60 to 65%, normal diastolic function, normal RV function, no significant valvular disease, dilated ascending aorta measuring 43 mm.  Since last clinic visit, he reports he is doing okay.  Continues to have some chest pain with a significant exertion such as cutting wood. Denies any dyspnea, lightheadedness, syncope, lower extremity edema, or palpitations.  Denies any bleeding on DAPT.    Past Medical History:  Diagnosis Date   Basal cell carcinoma of face    burned off   CAD (coronary artery disease)    a. PCI to LAD in 2002 b. 02/2016: Cath w/ 85% stenosis mid-LAD, DES placed.   Chronic lower back pain    Exposure to TB    GERD (gastroesophageal reflux disease)    History of hiatal hernia    Hyperlipidemia    simvastatin caused severe myalgias. Tolerated pravastatin.    Melanoma of lip (HCC) 2016   upper lip   Peripheral neuropathy    Refusal of blood transfusions as patient is Jehovah's Witness     Past Surgical History:  Procedure Laterality Date   CARDIAC CATHETERIZATION  01/2010   CARDIAC CATHETERIZATION N/A 02/03/2016   Procedure: Left Heart Cath and Coronary Angiography;  Surgeon: Ezra GORMAN Shuck, MD;  Location: [facility] INVASIVE CV LAB;  Service:  Cardiovascular;  Laterality: N/A;   CARDIAC CATHETERIZATION N/A 02/03/2016   Procedure: Intravascular Pressure Wire/FFR Study;  Surgeon: Lonni Hanson, MD;  Location[facility] INVASIVE CV LAB;  Service: Cardiovascular;  Laterality: N/A;   CARDIAC CATHETERIZATION N/A 02/03/2016   Procedure: Coronary Stent Intervention;  Surgeon: Lonni Hanson, MD;  Location: [facility] INVASIVE CV LAB;  Service: Cardiovascular;  Laterality: N/A;   CARDIAC CATHETERIZATION N/A 02/03/2016   Procedure: Intravascular Ultrasound/IVUS;  Surgeon: Lonni Hanson, MD;  Location: [facility] INVASIVE CV LAB;  Service: Cardiovascular;  Laterality: N/A;   CORONARY ANGIOPLASTY WITH STENT PLACEMENT  05/2001; 02/03/2016   1 stent; 1 stent   MELANOMA EXCISION  2016   upper lip   POSTERIOR LUMBAR FUSION  10/2001   Thoracolumbar decompressive laminectomy with bilateral L1 transpedicular decompressions and open reduction of L1 fracture. Posterolateral fusion from T11 to L3 using segmental pedicle screw instrumentation/notes 11/15/2010   TONSILLECTOMY      Current Medications: Current Meds  Medication Sig   amLODipine  (NORVASC ) 5 MG tablet Take 1 tablet (5 mg total) by mouth daily.   Ascorbic Acid (VITAMIN C) 100 MG tablet Take 100 mg by mouth daily.   aspirin  81 MG tablet Take 81 mg by mouth daily.     Calcium  Carbonate (CALCIUM  600 PO) Take 1 tablet by mouth daily.   clopidogrel  (PLAVIX ) 75 MG tablet Take 1 tablet (75 mg total) by mouth daily.   CRESTOR   10 MG tablet TAKE ONE TABLET BY MOUTH ONCE DAILY (MAKE AN APPOINTMENT FOR FURTHER REFILLS)   gabapentin  (NEURONTIN ) 600 MG tablet Take 600 mg by mouth 2 (two) times daily.    GLUCOSAMINE HCL-MSM PO Take 1 tablet by mouth daily.   isosorbide  mononitrate (IMDUR ) 60 MG 24 hr tablet Take 1 tablet (60 mg total) by mouth daily.   methocarbamol  (ROBAXIN ) 500 MG tablet Take 500 mg by mouth every 8 (eight) hours as needed for muscle spasms.    Multiple Vitamin (MULTIVITAMIN) tablet Take 1 tablet by mouth  daily.     nitroGLYCERIN  (NITROSTAT ) 0.4 MG SL tablet DISSOLVE ONE TABLET UNDER THE TONGUE EVERY 5 MINUTES AS NEEDED FOR CHEST PAIN.  DO NOT EXCEED A TOTAL OF 3 DOSES IN 15 MINUTES   Omega-3 Fatty Acids (FISH OIL) 1200 MG CAPS Take 1 capsule by mouth daily.   oxyCODONE -acetaminophen  (PERCOCET) 5-325 MG per tablet Take 1 tablet by mouth every 8 (eight) hours as needed for moderate pain.    tamsulosin  (FLOMAX ) 0.4 MG CAPS capsule Take 0.4 mg by mouth daily.   vitamin B-12 (CYANOCOBALAMIN) 100 MCG tablet Take 100 mcg by mouth daily.     Allergies:   Simvastatin and Tuberculin tests   Social History   Socioeconomic History   Marital status: Married    Spouse name: Not on file   Number of children: Not on file   Years of education: Not on file   Highest education level: Not on file  Occupational History   Not on file  Tobacco Use   Smoking status: Never   Smokeless tobacco: Never  Substance and Sexual Activity   Alcohol use: No   Drug use: No   Sexual activity: Never  Other Topics Concern   Not on file  Social History Narrative   Retired curator, disability from back injury.    Married-Lives in [place]   Alcohol Use - no   Tobacco Use - No.    Smoking Status:  never   Social Drivers of Health   Tobacco Use: Low Risk (04/01/2023)   Patient History    Smoking Tobacco Use: Never    Smokeless Tobacco Use: Never    Passive Exposure: Not on file  Financial Resource Strain: Not on file  Food Insecurity: Not on file  Transportation Needs: Not on file  Physical Activity: Not on file  Stress: Not on file  Social Connections: Not on file  Depression (EYV7-0): Not on file  Alcohol Screen: Not on file  Housing: Not on file  Utilities: Not on file  Health Literacy: Not on file     Family History: The patient's family history includes Heart attack in his father; Stroke in his mother.  ROS:   Please see the history of present illness.     All other systems reviewed and  are negative.  EKGs/Labs/Other Studies Reviewed:    The following studies were reviewed today:   EKG:   03/31/24: Sinus bradycardia, rate 44, first-degree AV block  Recent Labs: 03/31/2024: ALT 18; BUN 12; Creatinine, Ser 1.02; Hemoglobin 13.6; Platelets 174; Potassium 4.8; Sodium 142  Recent Lipid Panel    Component Value Date/Time   CHOL 119 03/31/2024 1026   TRIG 72 03/31/2024 1026   HDL 47 03/31/2024 1026   CHOLHDL 2.5 03/31/2024 1026   CHOLHDL 3.1 01/24/2016 1638   VLDL 32 (H) 01/24/2016 1638   LDLCALC 57 03/31/2024 1026    Physical Exam:    VS:  BP 116/68 (  BP Location: Left Arm, Patient Position: Sitting, Cuff Size: Normal)   Pulse 72   Ht 5' 8 (1.727 m)   Wt 190 lb (86.2 kg)   SpO2 94%   BMI 28.89 kg/m     Wt Readings from Last 3 Encounters:  07/07/24 190 lb (86.2 kg)  03/31/24 183 lb 3.2 oz (83.1 kg)  04/02/23 173 lb (78.5 kg)     GEN:  Well nourished, well developed in no acute distress HEENT: Normal NECK: No JVD; No carotid bruits LYMPHATICS: No lymphadenopathy CARDIAC: Bradycardic, regular, no murmurs, rubs, gallops RESPIRATORY:  Clear to auscultation without rales, wheezing or rhonchi  ABDOMEN: Soft, non-tender, non-distended MUSCULOSKELETAL:  No edema; No deformity  SKIN: Warm and dry NEUROLOGIC:  Alert and oriented x 3 PSYCHIATRIC:  Normal affect   ASSESSMENT:    1. Coronary artery disease involving native coronary artery of native heart without angina pectoris   2. Bradycardia   3. Essential hypertension   4. Hyperlipidemia, unspecified hyperlipidemia type   5. Aortic dilatation     PLAN:    CAD: History of PCI to LAD with BMS in 2002.  Cath in 2017 showed 85% mid LAD stenosis status post DES.  Reported chest pain and underwent Stress PET 06/17/2024 which showed normal perfusion, normal myocardial blood flow reserve, LVEF 63%.  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 - Continue aspirin  81 mg daily, Plavix  75 mg daily - Continue Imdur  60 mg daily - Discontinue carvedilol  due to bradycardia - Continue rosuvastatin  10 mg daily  Bradycardia: Sinus bradycardia with heart rate 44 bpm at prior clinic visit.  Reported intermittent lightheadedness, carvedilol  discontinued.  Heart rate improved, 72 bpm in clinic today  Hyperlipidemia:on rosuvastatin  10mg .  LDL 57 03/2024  Hypertension: On amlodipine  5 mg daily and Imdur  60 mg daily.  Appears controlled  Dilated aorta: Ascending aorta measured 43 mm on echocardiogram 06/2024.  Repeat echocardiogram in 1 year to monitor  RTC in 6 months     Medication Adjustments/Labs and Tests Ordered: Current medicines are reviewed at length with the patient today.  Concerns regarding medicines are outlined above.  No orders of the defined types were placed in this encounter.  No orders of the defined types were placed in this encounter.   Patient Instructions  Medication Instructions:  Your physician recommends that you continue on your current medications as directed. Please refer to the Current Medication list given to you today.  *If you need a refill on your cardiac medications before your next appointment, please call your pharmacy*  Lab Work: None ordered If you have labs (blood work) drawn today and your tests are completely normal, you will receive your results only by: MyChart Message (if you have MyChart) OR A paper copy in the mail If you have any lab test that is abnormal or we need to change your treatment, we will call you to review the results.  Testing/Procedures: None ordered  Follow-Up: At [facility], you and your health needs are our priority.  As part of our continuing mission to provide you with exceptional heart care, our providers are all part of one team.  This team includes your primary Cardiologist (physician) and  Advanced Practice Providers or APPs (Physician Assistants and Nurse Practitioners) who all work together to provide you with the care you need, when you need it.  Your next appointment:   6  month(s)  Provider:   Lonni LITTIE Nanas, MD    We recommend signing up for the patient portal called MyChart.  Sign up information is provided on this After Visit Summary.  MyChart is used to connect with patients for Virtual Visits (Telemedicine).  Patients are able to view lab/test results, encounter notes, upcoming appointments, etc.  Non-urgent messages can be sent to your provider as well.   To learn more about what you can do with MyChart, go to [email or web address].              Signed, Lonni LITTIE Nanas, MD  07/07/2024 9:20 AM    [facility] "

## 2024-07-07 ENCOUNTER — Ambulatory Visit: Attending: Cardiology | Admitting: Cardiology

## 2024-07-07 VITALS — BP 116/68 | HR 72 | Ht 68.0 in | Wt 190.0 lb

## 2024-07-07 DIAGNOSIS — R001 Bradycardia, unspecified: Secondary | ICD-10-CM | POA: Insufficient documentation

## 2024-07-07 DIAGNOSIS — I2510 Atherosclerotic heart disease of native coronary artery without angina pectoris: Secondary | ICD-10-CM | POA: Diagnosis not present

## 2024-07-07 DIAGNOSIS — E785 Hyperlipidemia, unspecified: Secondary | ICD-10-CM | POA: Diagnosis not present

## 2024-07-07 DIAGNOSIS — I77819 Aortic ectasia, unspecified site: Secondary | ICD-10-CM | POA: Diagnosis not present

## 2024-07-07 DIAGNOSIS — I10 Essential (primary) hypertension: Secondary | ICD-10-CM | POA: Insufficient documentation

## 2024-07-07 NOTE — Patient Instructions (Signed)
 Medication Instructions:  Your physician recommends that you continue on your current medications as directed. Please refer to the Current Medication list given to you today.  *If you need a refill on your cardiac medications before your next appointment, please call your pharmacy*  Lab Work: None ordered If you have labs (blood work) drawn today and your tests are completely normal, you will receive your results only by: MyChart Message (if you have MyChart) OR A paper copy in the mail If you have any lab test that is abnormal or we need to change your treatment, we will call you to review the results.  Testing/Procedures: None ordered  Follow-Up: At [facility], you and your health needs are our priority.  As part of our continuing mission to provide you with exceptional heart care, our providers are all part of one team.  This team includes your primary Cardiologist (physician) and Advanced Practice Providers or APPs (Physician Assistants and Nurse Practitioners) who all work together to provide you with the care you need, when you need it.  Your next appointment:   6 month(s)  Provider:   Lonni LITTIE Nanas, MD    We recommend signing up for the patient portal called MyChart.  Sign up information is provided on this After Visit Summary.  MyChart is used to connect with patients for Virtual Visits (Telemedicine).  Patients are able to view lab/test results, encounter notes, upcoming appointments, etc.  Non-urgent messages can be sent to your provider as well.   To learn more about what you can do with MyChart, go to [email or web address].
